# Patient Record
Sex: Male | Born: 1945 | Race: Black or African American | Hispanic: No | Marital: Married | Smoking: Current every day smoker
Health system: Southern US, Community
[De-identification: ages and names within clinical notes are randomized; demographics above are authoritative.]

## PROBLEM LIST (undated history)

## (undated) DIAGNOSIS — M1990 Unspecified osteoarthritis, unspecified site: Secondary | ICD-10-CM

## (undated) DIAGNOSIS — I639 Cerebral infarction, unspecified: Secondary | ICD-10-CM

## (undated) DIAGNOSIS — R0600 Dyspnea, unspecified: Secondary | ICD-10-CM

## (undated) DIAGNOSIS — J449 Chronic obstructive pulmonary disease, unspecified: Secondary | ICD-10-CM

## (undated) DIAGNOSIS — Z720 Tobacco use: Secondary | ICD-10-CM

## (undated) DIAGNOSIS — I2510 Atherosclerotic heart disease of native coronary artery without angina pectoris: Secondary | ICD-10-CM

## (undated) DIAGNOSIS — I509 Heart failure, unspecified: Secondary | ICD-10-CM

## (undated) DIAGNOSIS — E7800 Pure hypercholesterolemia, unspecified: Secondary | ICD-10-CM

## (undated) DIAGNOSIS — I10 Essential (primary) hypertension: Secondary | ICD-10-CM

---

## 2003-08-18 ENCOUNTER — Emergency Department (HOSPITAL_COMMUNITY): Admission: AD | Admit: 2003-08-18 | Discharge: 2003-08-18 | Payer: Self-pay | Admitting: Emergency Medicine

## 2005-07-01 HISTORY — PX: CORONARY ANGIOPLASTY WITH STENT PLACEMENT: SHX49

## 2005-09-09 ENCOUNTER — Inpatient Hospital Stay (HOSPITAL_COMMUNITY): Admission: EM | Admit: 2005-09-09 | Discharge: 2005-09-11 | Payer: Self-pay | Admitting: Emergency Medicine

## 2005-09-10 ENCOUNTER — Encounter (INDEPENDENT_AMBULATORY_CARE_PROVIDER_SITE_OTHER): Payer: Self-pay | Admitting: Cardiology

## 2005-09-24 ENCOUNTER — Encounter: Admission: RE | Admit: 2005-09-24 | Discharge: 2005-12-23 | Payer: Self-pay | Admitting: Neurology

## 2006-04-07 ENCOUNTER — Inpatient Hospital Stay (HOSPITAL_COMMUNITY): Admission: RE | Admit: 2006-04-07 | Discharge: 2006-04-08 | Payer: Self-pay | Admitting: Cardiology

## 2007-05-17 ENCOUNTER — Emergency Department (HOSPITAL_COMMUNITY): Admission: EM | Admit: 2007-05-17 | Discharge: 2007-05-17 | Payer: Self-pay | Admitting: Emergency Medicine

## 2010-11-16 NOTE — Cardiovascular Report (Signed)
Randy Walter, Randy Walter                ACCOUNT NO.:  [id number]   MEDICAL RECORD NO.:  [id number]          PATIENT TYPE:  OIB   LOCATION:  2899                         FACILITY:  [facility]   PHYSICIAN:  Armanda Magic, M.D.     DATE OF BIRTH:  06/13/1945   DATE OF PROCEDURE:  04/07/2006  DATE OF DISCHARGE:                              CARDIAC CATHETERIZATION   PROCEDURE:  Left heart catheterization, coronary angiography.  Left  ventriculography was not performed because of the patient's borderline  creatinine elevation.   OPERATOR:  Armanda Magic, M.D.   INDICATIONS:  Chest pain, LV dysfunction.   COMPLICATIONS:  None.   IV ACCESS:  Via right femoral artery, 6-French sheath.   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  He also  has a history of ongoing tobacco abuse and a history of alcohol intake in  the past.  He was noted back in March to have some LV dysfunction with an EF  of 30-40% that time.  He now presents for cardiac catheterization due to  intermittent chest pain worrisome for angina.   The patient was brought to the cardiac catheterization laboratory in a  fasting nonsedated state.  Informed consent was obtained.  The patient was  connected to continuous heart rate and pulse oximetry monitoring and  intermittent blood pressure monitoring.  The right groin was prepped and  draped in the sterile fashion; 1% Xylocaine was used for local anesthesia.  Using the modified Seldinger technique, a 6-French sheath was placed in  right femoral artery.  Under fluoroscopic guidance, a 6-French JL-4 catheter  was placed in the left coronary artery.  Multiple cine films were taken at  30 degree RAO and 40 degree LAO views.  This catheter was then exchanged out  over a guidewire for a 6-French JR-4 catheter which was placed under  fluoroscopic guidance in the right coronary artery.  Multiple  cine films  were taken in the 30 degree RAO and 40 degree LAO views.  This catheter was  then exchanged out over a guidewire for a 6-French angled pigtail catheter  which was placed under fluoroscopic guidance in the left ventricular cavity.  Left ventricular pressure was measured.  The catheter was then pulled back  across the aortic valve with no significant gradient noted.  At the end of  the procedure, the patient went on to PCI of the LAD by Dr. Katrinka Blazing.   The left main coronary artery is widely patent by bifurcates to the left  anterior descending artery and left circumflex artery.  The left anterior  descending artery gives rise to a very large diagonal one, which is widely  patent and bifurcates into 2 daughter vessels, both of which are widely  patent.  The mid LAD between the first and second diagonals has a 70-80%  lesion, and then the rest of the LAD going to the apex is widely patent.  It  gives off a second diagonal branch which is widely patent.   The  left circumflex is widely patent throughout its course in the AV groove,  giving rise to a large obtuse marginal branch one, which bifurcates into 2  daughter branches and is widely patent.   The right coronary is widely patent in its proximal portion.  There was 50%  narrowing in the mid RCA with then bifurcates into the posterior descending  artery and posterior lateral artery, both of which are widely patent.   Left ventricular pressure 172/3 mmHg, aortic pressure 175/90 mmHg, LVEDP 14  mmHg.   ASSESSMENT:  1. 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  2. Chest pain syndrome,  3. Mild left ventricular dysfunction, ejection fraction by echo in March      of 30 to 40%.  4. Hypertension with elevated blood pressure.   PLAN:  Increase lisinopril to 20 mg a day, aspirin 325 mg a day.  PCI of the  LAD by Dr. Katrinka Blazing.      Armanda Magic, M.D.  Electronically Signed     TT/MEDQ  D:   04/07/2006  T:  04/08/2006  Job:  [id number]   cc:   Almedia Balls

## 2010-11-16 NOTE — Discharge Summary (Signed)
[name], [name] NO.:  [id number]   MEDICAL RECORD NO.:  [id number]          PATIENT TYPE:  INP   LOCATION:  6522                         FACILITY:  [facility]   PHYSICIAN:  [name], P.A.       DATE OF BIRTH:  06/13/1945   DATE OF ADMISSION:  04/07/2006  DATE OF DISCHARGE:  04/08/2006                               DISCHARGE SUMMARY   DISCHARGE DIAGNOSES:  1. Coronary artery disease, status post bare metal stent to the left      anterior descending on April 07, 2006.  2. Hypertension.  3. Mild left ventricular dysfunction by echocardiogram of      approximately 35%.  4. A history of cerebrovascular accident, felt secondary to small-      vessel disease.  5. Remote alcohol use.  6. Tobacco use, smoking cessation.  7. Altered medication use.   HOSPITAL COURSE:  [name] is a 65 year old patient who presented to  the office on April 01, 2006 complaining of substernal chest pain as  well as dyspnea on exertion.  A previous echo around March of 2007 at  the time of his CVA showed an EF of about 30%.  There was moderate  diffuse LV hypokinesis with severe hypokinesis of the basilar inferior  wall with mild increased thickness of the aortic valve.  Left atrium was  dilated.   Because of the patient's symptoms, the patient was brought to [facility] on April 07, 2006 for cardiac catheterization.  He was found  to have a mid LAD lesion, 80%, with a 50% mid RCA lesion.   The patient then underwent percutaneous intervention utilizing a bare  metal stent to the LAD under the care of Dr. Verdis Prime.  The patient  tolerated the procedure well, remained in the hospital overnight, and  was ready for discharge the following day.   DISCHARGE LABS:  Include BUN 8, creatinine 0.9, white count 5.1,  hemoglobin 12.7.   DISCHARGE MEDICATIONS:  Include:  1. Plavix 75 mg a day for 3 weeks.  2. Enteric-coated aspirin 325 mg a day.  3. Lisinopril 20 mg a day.   FOLLOWUP:  Follow up with Dr. Mayford Knife on April 24, 2006 at 10:16 a.m.,  clean over cath site gently with soap and water, call for any recurrent  chest pain, no driving for 2 days, no lifting over 10 pounds for 1 week,  remain on a low-fat diet.      [name], P.A.     LB/MEDQ  D:  06/09/2006  T:  06/10/2006  Job:  [id number]   cc:   Armanda Magic, M.D.  Almedia Balls

## 2010-11-16 NOTE — Discharge Summary (Signed)
[name], [name]                ACCOUNT NO.:  [id number]   MEDICAL RECORD NO.:  [id number]          PATIENT TYPE:  INP   LOCATION:  3014                         FACILITY:  [facility]   PHYSICIAN:  Pramod P. Pearlean Brownie, MD    DATE OF BIRTH:  06/13/1945   DATE OF ADMISSION:  09/09/2005  DATE OF DISCHARGE:  09/11/2005                                 DISCHARGE SUMMARY   DIAGNOSES AT TIME OF DISCHARGE:  1.  Right corona radiata infarct secondary to small-vessel disease.  2.  Hypertension.  3.  Dyslipidemia.  4.  Tobacco abuse.  5.  Alcohol abuse.   MEDICINES AT TIME OF DISCHARGE:  1.  Aspirin 325 mg a day.  2.  Lisinopril 10 mg a day.   STUDIES PERFORMED:  1.  CT of the brain on admission showed atrophy with bilateral basal ganglia      lacunar infarcts, no acute abnormalities.  2.  MRI of the brain shows acute infarct, right frontoparietal      periventricular white matter.  3.  MRA of the head shows moderate intracranial atherosclerotic disease.  4.  Chest x-ray shows COPD with bronchitic and minimal interstitial changes,      question bilateral nipple shadows.  Repeat chest x-ray is recommended.      It has been ordered prior to discharge.  5.  Carotid Doppler is normal.  6.  Transcranial Dopplers:  No significant stenosis.  Has decreased      posterior circulation secondary to poor windows.  7.  2-D echocardiogram has been performed, results pending.  8.  EKG shows normal sinus rhythm with first degree AV block, nonspecific T-      wave abnormality, prolonged QT.   LABORATORY STUDIES:  Hemoglobin 13.0, hematocrit 38.3, white blood cells  3.5, red blood cells 4.08, RDW was 14.7, otherwise normal.  Differential was  40 of neutrophils, 48 lymphocytes, 10 monocytes, 2 eosinophils, 1 basophil.  Coagulation studies were normal.  Chemistry normal.  Liver function tests  normal.  Hemoglobin A1c 6.1.  Alcohol level was 43.  Urinalysis was normal.  Fasting lipid panel and homocysteine are  pending at time of discharge.   HISTORY OF PRESENT ILLNESS:  [name] is a 65 year old right-handed  white male with no known medical history.  He had sudden onset of weakness  and a funny feeling involving his left arm, sparing his left face, on  Saturday, September 07, 2005.  It worsened on September 09, 2005.  The patient's  wife also reports that he had some slurred speech with fluctuation of his  symptoms.  He presented for evaluation.  CT of the head in the emergency  room showed no acute abnormality.  He was admitted for further stroke  workup.   MRI did reveal an acute infarct, felt to be etiology of his symptoms.  He  has multiple vascular risk factors, including hypertension which is not  treated, dyslipidemia which is not treated, and smoking and alcohol abuse.  The patient has been advised to decrease alcohol to two 1-ounce liquor  drinks per day and  to stop smoking.  He was placed on Altace for blood  pressure control, which was changed to lisinopril at the time of discharge  secondary to cost and availability of lisinopril at [facility] for $4.  The  patient was placed on aspirin for secondary stroke prevention.  He continues  with some mild clumsiness in his left upper extremity secondary to the  stroke, and occupational therapy recommended outpatient OT follow-up.  They  also recommended driving assessment; however, the patient does not drive and  has his wife and son transport him.  We will arrange outpatient OT.   CONDITION ON DISCHARGE:  Patient alert and oriented x3, no aphasia or  dysarthria.  Minimal left facial asymmetry.  Left fine motor movement  decreased and right arm orbits over the left.  He has no leg weakness, no  sensory loss.  He ambulates with minimal difficulty.  His chest is clear to  auscultation. His heart rate is regular.  His abdomen is nontender.   DISCHARGE PLAN:  1.  Discharge home with family.  2.  Aspirin for secondary stroke prevention.  3.   Tight risk factor control needed by primary care physician for blood      pressure, alcohol and smoking as well as dyslipidemia.  4.  Get a primary care physician.  The patient has been referred to      [facility].  5.  Follow up with [name], N.P., at [facility] in one month.      [name], N.P.    ______________________________  Sunny Schlein. Pearlean Brownie, MD    SB/MEDQ  D:  09/11/2005  T:  09/12/2005  Job:  [id number]   cc:   Carolyne Fiscal

## 2010-11-16 NOTE — H&P (Signed)
NAME:  Randy Walter, Randy Walter NO.:  [id number]   MEDICAL RECORD NO.:  [id number]          PATIENT TYPE:  EMS   LOCATION:  MAJO                         FACILITY:  [facility]   PHYSICIAN:  Michael L. Reynolds, M.D.DATE OF BIRTH:  06/13/1945   DATE OF ADMISSION:  09/09/2005  DATE OF DISCHARGE:                                HISTORY & PHYSICAL   CHIEF COMPLAINT:  Gait unsteadiness and left-sided feels funny.   HISTORY OF PRESENT ILLNESS:  This is the initial inpatient consultation  evaluation of this 65 year old man with no known chronic medical problems.  The patient reports that since Saturday, September 07, 2004 he has had a  sensation of weakness and a funny feeling involving his left arm and leg  sparing his face which worsened today.  His wife reports today that he has  had some slurred speech.  He has had a little bit of fluctuation in  symptoms.  He denies any known history of stroke.   PAST MEDICAL HISTORY:  No known chronic medical problems, though the patient  does not go to doctors.   FAMILY HISTORY:  Remarkable for hypertension.   SOCIAL HISTORY:  He lives with his wife.  He is normally independent in his  activities of daily living.  He smokes a half pack of cigarettes a day and  consumes about a half gallon of liquor per week.   ALLERGIES:  No known drug allergies.   MEDICATIONS:  None.   REVIEW OF SYSTEMS:  He reports occasional left-sided headaches, occasional  dyspnea on exertion.  He denies chest pain or palpitation.  Otherwise, full  10-system review of systems negative except as outlined in the HPI and in  the ER and admission nursing records.   PHYSICAL EXAMINATION:  VITAL SIGNS:  Temperature 98.4, blood pressure  187/106, pulse 81, respirations 20.  GENERAL:  This is a healthy-appearing man, seated, in no evident distress.  HEENT:  Head:  Cranium is normocephalic, atraumatic.  Oropharynx benign.  NECK:  Supple without carotid or supraclavicular  bruits.  HEART:  Regular rate and rhythm without murmurs.  CHEST:  Clear to auscultation bilaterally.  ABDOMEN:  Soft.  Normoactive bowel sounds.  EXTREMITIES:  No edema.  NEUROLOGIC:  Mental status:  He is awake, alert, and fully oriented.  He is  able to name objects and repeat phrases without difficulty.  He is able to  follow one and two-step commands without difficulty.  Recent and remote  memory are intact.  Attention span, concentration, and fund of knowledge are  all adequate.  Cranial nerves:  Funduscopic examination reveals chronic  hypertensive changes.  Pupils equal and reactive.  Extraocular movements  full without nystagmus.  Visual fields full to confrontation.  Hearing is  intact.  Conversational speech.  Face intact to pinprick.  Face, tongue, and  palate move normally and symmetrically.  Motor:  Normal bulk and tone.  Normal strength in all tested extremity muscles.  Sensation intact to light  touch and pin prick in all extremities.  Coordination:  Rapid movements were  performed well.  Finger to  nose is performed well.  Gait:  He arises easily  from a chair and stance is normal.  He has great difficulty standing with a  narrow base tending to lean to the left and he falls almost immediately to  the left upon closing his eyes for a Romberg maneuver.  Reflexes 2+ and  symmetric.  Toes are downgoing.   LABORATORIES:  CBC:  White count 3.5, hemoglobin 13, platelets 227,000.  BMET is unremarkable.  LFTs are pending.  Coags are normal.  Urinalysis was  negative.  CT of the head is personally reviewed.  It demonstrates bilateral  lacunar infarcts of uncertain age.   IMPRESSION:  Probable right subcortical lacunar stroke with left subjective  symptoms and gait disorder.  Primary risk factor is uncontrolled  hypertension.   PLAN:  Will admit for MRI/MRA, carotid and transcranial Dopplers, 2-D  echocardiogram, and stroke laboratories.  Will place on aspirin and probably   start hypertension medications.  He will be evaluated by physical and  occupational, and speech therapy.  Stroke service to follow.      Michael L. Thad Ranger, M.D.  Electronically Signed     MLR/MEDQ  D:  09/09/2005  T:  09/10/2005  Job:  [id number]

## 2010-11-16 NOTE — Cardiovascular Report (Signed)
NAMEPRIMUS, GRITTON NO.:  [id number]   MEDICAL RECORD NO.:  [id number]          PATIENT TYPE:  INP   LOCATION:  2807                         FACILITY:  [facility]   PHYSICIAN:  Lyn Records, M.D.   DATE OF BIRTH:  06/13/1945   DATE OF PROCEDURE:  04/07/2006  DATE OF DISCHARGE:                              CARDIAC CATHETERIZATION   INDICATIONS:  Patient with a cardiomyopathy, decreased LV function and  exertional angina.  Catheterization today demonstrated a 70% to at most 80%  LAD in the mid vessel.   PROCEDURE PERFORMED:  Percutaneous coronary intervention with direct stent  using a nondrug-eluting stent.   DESCRIPTION:  A 6-French sheath used for the diagnostic catheterization was  exchanged under sterile conditions.   The patient and his wife were informed of the procedure and the likelihood  that the LAD lesion was causing angina.  They agreed to proceeding with  stenting.  We discussed the merit of drug-eluting stent versus bare-metal  stent.  We decided on bare-metal stent because of the size of the vessel and  also some anticipated difficulty with finances and staying on Plavix for a  year.   An XB 6-French 3.5 catheter was used to obtain guiding shots, and an Marketing executive was used to cross the LAD lesion.  Direct stenting using an 18  x 3.5-mm Vision stent was performed to 14 atmospheres, and post dilatation  was performed with a 3.5 x 12-mm Quantum balloon to 16 atmospheres.  The  patient tolerated the procedure without complications.  The final  angiographic result demonstrated a step-up.  Zero percent stenosis was felt  to be present post stent implantation with TIMI grade 3 flow noted.   CONCLUSION:  Successful direct stent of the LAD with a bare-metal stent  (Vision) from 70% to 0%.   PLAN:  Per Dr. Mayford Knife, Plavix for at least 3 weeks.      Lyn Records, M.D.  Electronically Signed     HWS/MEDQ  D:  04/07/2006  T:  04/08/2006   Job:  [id number]   cc:   Almedia Balls

## 2011-04-09 LAB — URINALYSIS, ROUTINE W REFLEX MICROSCOPIC
Glucose, UA: NEGATIVE
Protein, ur: NEGATIVE
Specific Gravity, Urine: 1.027
Urobilinogen, UA: 1

## 2013-12-03 ENCOUNTER — Emergency Department (HOSPITAL_COMMUNITY): Payer: Medicare Other

## 2013-12-03 ENCOUNTER — Inpatient Hospital Stay (HOSPITAL_COMMUNITY)
Admission: EM | Admit: 2013-12-03 | Discharge: 2013-12-05 | DRG: 191 | Disposition: A | Discharge status: Home Health | Payer: Medicare Other | Attending: Internal Medicine | Admitting: Internal Medicine

## 2013-12-03 ENCOUNTER — Encounter (HOSPITAL_COMMUNITY): Payer: Self-pay | Admitting: Emergency Medicine

## 2013-12-03 DIAGNOSIS — I509 Heart failure, unspecified: Secondary | ICD-10-CM

## 2013-12-03 DIAGNOSIS — J441 Chronic obstructive pulmonary disease with (acute) exacerbation: Secondary | ICD-10-CM

## 2013-12-03 DIAGNOSIS — F17200 Nicotine dependence, unspecified, uncomplicated: Secondary | ICD-10-CM | POA: Diagnosis present

## 2013-12-03 DIAGNOSIS — J209 Acute bronchitis, unspecified: Principal | ICD-10-CM

## 2013-12-03 DIAGNOSIS — I2510 Atherosclerotic heart disease of native coronary artery without angina pectoris: Secondary | ICD-10-CM

## 2013-12-03 DIAGNOSIS — I10 Essential (primary) hypertension: Secondary | ICD-10-CM

## 2013-12-03 DIAGNOSIS — I5042 Chronic combined systolic (congestive) and diastolic (congestive) heart failure: Secondary | ICD-10-CM

## 2013-12-03 DIAGNOSIS — R0602 Shortness of breath: Secondary | ICD-10-CM

## 2013-12-03 DIAGNOSIS — Z9861 Coronary angioplasty status: Secondary | ICD-10-CM

## 2013-12-03 DIAGNOSIS — R0902 Hypoxemia: Secondary | ICD-10-CM

## 2013-12-03 DIAGNOSIS — R509 Fever, unspecified: Secondary | ICD-10-CM

## 2013-12-03 DIAGNOSIS — J449 Chronic obstructive pulmonary disease, unspecified: Secondary | ICD-10-CM

## 2013-12-03 DIAGNOSIS — J440 Chronic obstructive pulmonary disease with acute lower respiratory infection: Principal | ICD-10-CM | POA: Diagnosis present

## 2013-12-03 DIAGNOSIS — I160 Hypertensive urgency: Secondary | ICD-10-CM

## 2013-12-03 DIAGNOSIS — Z8249 Family history of ischemic heart disease and other diseases of the circulatory system: Secondary | ICD-10-CM

## 2013-12-03 HISTORY — DX: Unspecified osteoarthritis, unspecified site: M19.90

## 2013-12-03 HISTORY — DX: Essential (primary) hypertension: I10

## 2013-12-03 HISTORY — DX: Atherosclerotic heart disease of native coronary artery without angina pectoris: I25.10

## 2013-12-03 HISTORY — DX: Tobacco use: Z72.0

## 2013-12-03 LAB — CBC WITH DIFFERENTIAL/PLATELET
BASOS ABS: 0 10*3/uL (ref 0.0–0.1)
BASOS PCT: 0 % (ref 0–1)
EOS ABS: 0 10*3/uL (ref 0.0–0.7)
EOS PCT: 1 % (ref 0–5)
HEMATOCRIT: 41.6 % (ref 39.0–52.0)
HEMOGLOBIN: 13.4 g/dL (ref 13.0–17.0)
Lymphocytes Relative: 13 % (ref 12–46)
Lymphs Abs: 0.6 10*3/uL — ABNORMAL LOW (ref 0.7–4.0)
MCH: 30 pg (ref 26.0–34.0)
MCHC: 32.2 g/dL (ref 30.0–36.0)
MCV: 93.1 fL (ref 78.0–100.0)
MONO ABS: 0.5 10*3/uL (ref 0.1–1.0)
MONOS PCT: 10 % (ref 3–12)
Neutro Abs: 3.6 10*3/uL (ref 1.7–7.7)
Neutrophils Relative %: 76 % (ref 43–77)
Platelets: 179 10*3/uL (ref 150–400)
RBC: 4.47 MIL/uL (ref 4.22–5.81)
RDW: 14.2 % (ref 11.5–15.5)
WBC: 4.8 10*3/uL (ref 4.0–10.5)

## 2013-12-03 LAB — BASIC METABOLIC PANEL
BUN: 11 mg/dL (ref 6–23)
CALCIUM: 9.3 mg/dL (ref 8.4–10.5)
CHLORIDE: 101 meq/L (ref 96–112)
CO2: 27 mEq/L (ref 19–32)
CREATININE: 0.89 mg/dL (ref 0.50–1.35)
GFR calc non Af Amer: 86 mL/min — ABNORMAL LOW (ref >90)
Glucose, Bld: 112 mg/dL — ABNORMAL HIGH (ref 70–99)
Potassium: 4.3 mEq/L (ref 3.7–5.3)
Sodium: 139 mEq/L (ref 137–147)

## 2013-12-03 LAB — I-STAT CG4 LACTIC ACID, ED: LACTIC ACID, VENOUS: 1.42 mmol/L (ref 0.5–2.2)

## 2013-12-03 LAB — PRO B NATRIURETIC PEPTIDE: Pro B Natriuretic peptide (BNP): 2274 pg/mL — ABNORMAL HIGH (ref 0–125)

## 2013-12-03 LAB — TROPONIN I: Troponin I: <0.3 ng/mL (ref <0.30)

## 2013-12-03 MED ORDER — DEXTROSE 5 % IV SOLN
500.0000 mg | Freq: Once | INTRAVENOUS | Status: AC
  Administered 2013-12-03: 500 mg via INTRAVENOUS
  Filled 2013-12-03: qty 500

## 2013-12-03 MED ORDER — SODIUM CHLORIDE 0.9 % IJ SOLN
3.0000 mL | Freq: Two times a day (BID) | INTRAMUSCULAR | Status: DC
  Administered 2013-12-05: 3 mL via INTRAVENOUS

## 2013-12-03 MED ORDER — ACETAMINOPHEN 650 MG RE SUPP: 650.0000 mg | Freq: Four times a day (QID) | RECTAL | Status: DC | PRN

## 2013-12-03 MED ORDER — HYDROMORPHONE HCL PF 1 MG/ML IJ SOLN: 0.5000 mg | INTRAMUSCULAR | Status: DC | PRN

## 2013-12-03 MED ORDER — SODIUM CHLORIDE 0.9 % IV SOLN: 250.0000 mL | INTRAVENOUS | Status: DC | PRN

## 2013-12-03 MED ORDER — ONDANSETRON HCL 4 MG/2ML IJ SOLN
4.0000 mg | Freq: Once | INTRAMUSCULAR | Status: AC
  Administered 2013-12-03: 4 mg via INTRAVENOUS
  Filled 2013-12-03: qty 2

## 2013-12-03 MED ORDER — METHYLPREDNISOLONE SODIUM SUCC 125 MG IJ SOLR
125.0000 mg | Freq: Once | INTRAMUSCULAR | Status: AC
  Administered 2013-12-03: 125 mg via INTRAVENOUS
  Filled 2013-12-03: qty 2

## 2013-12-03 MED ORDER — AZITHROMYCIN 500 MG IV SOLR
500.0000 mg | INTRAVENOUS | Status: DC
  Administered 2013-12-04: 500 mg via INTRAVENOUS
  Filled 2013-12-03: qty 500

## 2013-12-03 MED ORDER — ALUM & MAG HYDROXIDE-SIMETH 200-200-20 MG/5ML PO SUSP: 30.0000 mL | Freq: Four times a day (QID) | ORAL | Status: DC | PRN

## 2013-12-03 MED ORDER — FUROSEMIDE 10 MG/ML IJ SOLN
40.0000 mg | Freq: Two times a day (BID) | INTRAMUSCULAR | Status: DC
  Administered 2013-12-04 – 2013-12-05 (×3): 40 mg via INTRAVENOUS
  Filled 2013-12-03 (×4): qty 4

## 2013-12-03 MED ORDER — ONDANSETRON HCL 4 MG PO TABS: 4.0000 mg | ORAL_TABLET | Freq: Four times a day (QID) | ORAL | Status: DC | PRN

## 2013-12-03 MED ORDER — ACETAMINOPHEN 325 MG PO TABS
650.0000 mg | ORAL_TABLET | Freq: Once | ORAL | Status: AC
  Administered 2013-12-03: 650 mg via ORAL
  Filled 2013-12-03: qty 2

## 2013-12-03 MED ORDER — DEXTROSE 5 % IV SOLN: 1.0000 g | INTRAVENOUS | Status: DC

## 2013-12-03 MED ORDER — PNEUMOCOCCAL VAC POLYVALENT 25 MCG/0.5ML IJ INJ
0.5000 mL | INJECTION | INTRAMUSCULAR | Status: AC
  Administered 2013-12-04: 0.5 mL via INTRAMUSCULAR
  Filled 2013-12-03 (×2): qty 0.5

## 2013-12-03 MED ORDER — ACETAMINOPHEN 325 MG PO TABS
650.0000 mg | ORAL_TABLET | Freq: Four times a day (QID) | ORAL | Status: DC | PRN
  Filled 2013-12-03: qty 2

## 2013-12-03 MED ORDER — FUROSEMIDE 10 MG/ML IJ SOLN
40.0000 mg | Freq: Once | INTRAMUSCULAR | Status: AC
  Administered 2013-12-03: 40 mg via INTRAVENOUS
  Filled 2013-12-03: qty 4

## 2013-12-03 MED ORDER — ENOXAPARIN SODIUM 40 MG/0.4ML ~~LOC~~ SOLN
40.0000 mg | Freq: Every day | SUBCUTANEOUS | Status: DC
  Administered 2013-12-04 (×2): 40 mg via SUBCUTANEOUS
  Filled 2013-12-03 (×3): qty 0.4

## 2013-12-03 MED ORDER — OXYCODONE HCL 5 MG PO TABS
5.0000 mg | ORAL_TABLET | ORAL | Status: DC | PRN
  Administered 2013-12-04: 5 mg via ORAL
  Filled 2013-12-03: qty 1

## 2013-12-03 MED ORDER — SODIUM CHLORIDE 0.9 % IJ SOLN: 3.0000 mL | INTRAMUSCULAR | Status: DC | PRN

## 2013-12-03 MED ORDER — MORPHINE SULFATE 4 MG/ML IJ SOLN
4.0000 mg | INTRAMUSCULAR | Status: DC | PRN
  Administered 2013-12-03: 4 mg via INTRAVENOUS
  Filled 2013-12-03: qty 1

## 2013-12-03 MED ORDER — ALBUTEROL SULFATE (2.5 MG/3ML) 0.083% IN NEBU
2.5000 mg | INHALATION_SOLUTION | Freq: Four times a day (QID) | RESPIRATORY_TRACT | Status: DC
  Administered 2013-12-04: 2.5 mg via RESPIRATORY_TRACT
  Filled 2013-12-03 (×3): qty 3

## 2013-12-03 MED ORDER — ONDANSETRON HCL 4 MG/2ML IJ SOLN: 4.0000 mg | Freq: Four times a day (QID) | INTRAMUSCULAR | Status: DC | PRN

## 2013-12-03 MED ORDER — DEXTROSE 5 % IV SOLN
1.0000 g | Freq: Once | INTRAVENOUS | Status: AC
  Administered 2013-12-03: 1 g via INTRAVENOUS
  Filled 2013-12-03: qty 10

## 2013-12-03 MED ORDER — ENALAPRILAT 1.25 MG/ML IV SOLN
1.2500 mg | Freq: Once | INTRAVENOUS | Status: AC
  Administered 2013-12-03: 1.25 mg via INTRAVENOUS
  Filled 2013-12-03: qty 1

## 2013-12-03 MED ORDER — NICOTINE 14 MG/24HR TD PT24
14.0000 mg | MEDICATED_PATCH | Freq: Every day | TRANSDERMAL | Status: DC
  Administered 2013-12-04 – 2013-12-05 (×3): 14 mg via TRANSDERMAL
  Filled 2013-12-03 (×3): qty 1

## 2013-12-03 MED ORDER — ACETAMINOPHEN 325 MG PO TABS
650.0000 mg | ORAL_TABLET | ORAL | Status: DC | PRN
  Administered 2013-12-04: 650 mg via ORAL

## 2013-12-03 MED ORDER — POTASSIUM CHLORIDE CRYS ER 20 MEQ PO TBCR
20.0000 meq | EXTENDED_RELEASE_TABLET | Freq: Two times a day (BID) | ORAL | Status: AC
  Administered 2013-12-04 – 2013-12-05 (×4): 20 meq via ORAL
  Filled 2013-12-03 (×4): qty 1

## 2013-12-03 MED ORDER — CARVEDILOL 3.125 MG PO TABS
3.1250 mg | ORAL_TABLET | Freq: Two times a day (BID) | ORAL | Status: DC
  Administered 2013-12-04 – 2013-12-05 (×5): 3.125 mg via ORAL
  Filled 2013-12-03 (×7): qty 1

## 2013-12-03 MED ORDER — PREDNISONE 50 MG PO TABS
60.0000 mg | ORAL_TABLET | Freq: Once | ORAL | Status: AC
  Administered 2013-12-04: 60 mg via ORAL
  Filled 2013-12-03: qty 1

## 2013-12-03 MED ORDER — ALBUTEROL SULFATE (2.5 MG/3ML) 0.083% IN NEBU
2.5000 mg | INHALATION_SOLUTION | RESPIRATORY_TRACT | Status: DC | PRN
  Administered 2013-12-03: 2.5 mg via RESPIRATORY_TRACT
  Filled 2013-12-03: qty 3

## 2013-12-03 MED ORDER — LISINOPRIL 10 MG PO TABS
10.0000 mg | ORAL_TABLET | Freq: Every day | ORAL | Status: DC
  Administered 2013-12-04 – 2013-12-05 (×2): 10 mg via ORAL
  Filled 2013-12-03 (×2): qty 1

## 2013-12-03 MED ORDER — SODIUM CHLORIDE 0.9 % IJ SOLN
3.0000 mL | Freq: Two times a day (BID) | INTRAMUSCULAR | Status: DC
  Administered 2013-12-04: 3 mL via INTRAVENOUS

## 2013-12-03 MED ORDER — ALBUTEROL SULFATE (2.5 MG/3ML) 0.083% IN NEBU: 2.5000 mg | INHALATION_SOLUTION | RESPIRATORY_TRACT | Status: DC | PRN

## 2013-12-03 NOTE — ED Notes (Signed)
Called to give report nurse unavailable will call back.  

## 2013-12-03 NOTE — ED Provider Notes (Addendum)
CSN: [id number]     Arrival date & time 12/03/13  1848 History   First MD Initiated Contact with Patient 12/03/13 1851     Chief Complaint  Patient presents with  . Cough      HPI  Patient presents via EMS. He reports a one-week illness. States that he feels hot and cold, and chills, but has not checked temperature. Feesl short of breath. Given albuterol in route. States this did slightly help. Is hypertensive over 200 systolic in route. Patient states it has been over 7 years since he has seen a physician. He is a daily smoker. He denies any documented history of hypertension or heart disease.  Denies any chest pain. States it hurts under his ribs when he coughs and has been coughing a lot. Feels short of breath at rest. No abdominal pain, nausea, vomiting, diarrhea. No extremity pain or swelling. No frank PND orthopnea. No exertional chest pain.  Past Medical History  Diagnosis Date  . Hypertension    History reviewed. No pertinent past surgical history. History reviewed. No pertinent family history. History  Substance Use Topics  . Smoking status: Current Every Day Smoker -- 0.50 packs/day for 60 years    Types: Cigarettes  . Smokeless tobacco: Never Used  . Alcohol Use: Yes    Review of Systems  Constitutional: Positive for fever. Negative for chills, diaphoresis, appetite change and fatigue.  HENT: Negative for mouth sores, sore throat and trouble swallowing.   Eyes: Negative for visual disturbance.  Respiratory: Positive for shortness of breath and wheezing. Negative for cough and chest tightness.   Cardiovascular: Negative for chest pain.       Pain with cough  Gastrointestinal: Negative for nausea, vomiting, abdominal pain, diarrhea and abdominal distention.  Endocrine: Negative for polydipsia, polyphagia and polyuria.  Genitourinary: Negative for dysuria, frequency and hematuria.  Musculoskeletal: Negative for gait problem.  Skin: Negative for color change, pallor and  rash.  Neurological: Positive for weakness. Negative for dizziness, syncope, light-headedness and headaches.  Hematological: Does not bruise/bleed easily.  Psychiatric/Behavioral: Negative for behavioral problems and confusion.      Allergies  Review of patient's allergies indicates no known allergies.  Home Medications   Prior to Admission medications   Not on File   BP 179/94  Pulse 106  Temp(Src) 99.8 F (37.7 C) (Oral)  Resp 22  SpO2 87% Physical Exam  Constitutional: He is oriented to person, place, and time. He appears well-developed and well-nourished. No distress.  Mild respiratory distress.  HENT:  Head: Normocephalic.  Eyes: Conjunctivae are normal. Pupils are equal, round, and reactive to light. No scleral icterus.  Neck: Normal range of motion. Neck supple. No thyromegaly present.  No JVD  Cardiovascular: Normal rate and regular rhythm.  Exam reveals no gallop and no friction rub.   No murmur heard. Pulmonary/Chest: Effort normal and breath sounds normal. No respiratory distress. He has no wheezes. He has no rales.  Diffuse wheezing and prolongation.   Abdominal: Soft. Bowel sounds are normal. He exhibits no distension. There is no tenderness. There is no rebound.  Musculoskeletal: Normal range of motion.  Neurological: He is alert and oriented to person, place, and time.  Skin: Skin is warm and dry. No rash noted.  No peripheral edema  Psychiatric: He has a normal mood and affect. His behavior is normal.    ED Course  Procedures (including critical care time) Labs Review Labs Reviewed  CBC WITH DIFFERENTIAL - Abnormal; Notable for the  following:    Lymphs Abs 0.6 (*)    All other components within normal limits  BASIC METABOLIC PANEL - Abnormal; Notable for the following:    Glucose, Bld 112 (*)    GFR calc non Af Amer 86 (*)    All other components within normal limits  PRO B NATRIURETIC PEPTIDE - Abnormal; Notable for the following:    Pro B  Natriuretic peptide (BNP) 2274.0 (*)    All other components within normal limits  CULTURE, BLOOD (ROUTINE X 2)  CULTURE, BLOOD (ROUTINE X 2)  TROPONIN I  I-STAT CG4 LACTIC ACID, ED    Imaging Review Dg Chest Port 1 View  12/03/2013   CLINICAL DATA:  Cough.  Chest pain.  EXAM: PORTABLE CHEST - 1 VIEW  COMPARISON:  Chest x-ray 09/09/2005.  FINDINGS: Diffuse peribronchial cuffing. No consolidative airspace disease. No pleural effusions. No evidence of pulmonary edema. Heart size is normal. Mediastinal contours are unremarkable. No pneumothorax.  IMPRESSION: 1. Diffuse peribronchial cuffing concerning for acute bronchitis.   Electronically Signed   By: Trudie Reed M.D.   On: 12/03/2013 19:38     EKG Interpretation   Date/Time:  Friday December 03 2013 18:56:53 EDT Ventricular Rate:  117 PR Interval:  175 QRS Duration: 106 QT Interval:  317 QTC Calculation: 442 R Axis:   48 Text Interpretation:  Sinus or ectopic atrial tachycardia Probable left  atrial enlargement Left ventricular hypertrophy Nonspecific T  abnormalities, lateral leads , new since last tracing Baseline wander in  lead(s) V2 Since last tracing rate faster Confirmed by KNAPP  MD-J, JON  ([id number]) on 12/03/2013 7:04:09 PM      MDM   Final diagnoses:  COPD exacerbation  CHF (congestive heart failure)  Hypertensive urgency  Fever  Hypoxemia    Recheck at the bedside 101.2 on my evaluation. Blood cultures obtained. Given Solu-Medrol, albuterol.After nebulized albuterol, 87% saturations.   Pt re-examined.  Improved air movement, less WOB.  Still with prolongation. Chest x-ray shows mild diffuse process. No organized infiltrate. BNP elevated. May be somewhat of a mixed picture with hypertension and CHF. Although febrile, and bronchospastic. BP improved after IV morphine.  Given Vasotec, and Lasix.  No acute abnormalities on EKG to suggest infarct. Normal troponin. Plan will be admission. He is less dyspneic. Work of   breathing is improved.    Rolland Porter, MD 12/03/13 0102  Rolland Porter, MD 12/03/13 2039

## 2013-12-03 NOTE — Progress Notes (Signed)
  CARE MANAGEMENT ED NOTE 12/03/2013  Patient:  Randy Walter, Randy Walter   Account Number:  [id number]  Date Initiated:  12/03/2013  Documentation initiated by:  Radford Pax  Subjective/Objective Assessment:   Patient presents to Ed with nonproductive cough for one week with wheezing.     Subjective/Objective Assessment Detail:   Patient with pmhx of hypertension     Action/Plan:   Action/Plan Detail:   Anticipated DC Date:       Status Recommendation to Physician:   Result of Recommendation:    Other ED Services  Consult Working Plan    DC Planning Services  Other  PCP issues    Choice offered to / List presented to:            Status of service:  Completed, signed off  ED Comments:   ED Comments Detail:  EDCM spoke to patient at bedside.  Patient confirms he has Medicare insurance without a pcp.  [facility] provided patient with a printed list of pcps who accept Medicare insurance within a 5 mile radius of patient's zip code [address].  PCP list given to patient's family member at bedside.  Patient thankful for resources.  No further EDCM needs at this time.

## 2013-12-03 NOTE — H&P (Signed)
[facility] History and Physical  Randy Walter [id number] DOB: 10-22-1945 DOA: 12/03/2013  Referring physician: EDP PCP: No primary provider on file.  Specialists:   Chief Complaint: Fever Cough and SOB  HPI: Randy Walter is a 68 y.o. male with a history of CAD, HTN, and Tobacco Use who has not seen a PCP in 7 years who presents to the ED with complaints of fevers ,chills and cough and SOB worsening over the past week.   He was hypoxic in the ED at a level of  87% in the ED and was placed on supplemental [place])2.  He was found to have a Pro-BNP of 2274.0, and a Chest X-ray revealing peribronchial thickening consistent with Acute Bronchitis. He was started on therapy and referred for medical admission.     Review of Systems:  Constitutional: No Weight Loss, No Weight Gain, Night Sweats, +Fevers, +Chills, Fatigue, +Generalized Weakness HEENT: No Headaches, Difficulty Swallowing,Tooth/Dental Problems,Sore Throat,  No Sneezing, Rhinitis, Ear Ache, Nasal Congestion, or Post Nasal Drip,  Cardio-vascular:  No Chest pain, Orthopnea, PND, Edema in lower extremities, Anasarca, Dizziness, Palpitations  Resp: +Dyspnea, +DOE, +Productive Cough, No Hemoptysis, +Wheezing.    GI: No Heartburn, Indigestion, Abdominal Pain, Nausea, Vomiting, Diarrhea, Change in Bowel Habits,  Loss of Appetite  GU: No Dysuria, Change in Color of Urine, No Urgency or Frequency.  No flank pain.  Musculoskeletal: No Joint Pain or Swelling.  No Decreased Range of Motion. No Back Pain.  Neurologic: No Syncope, No Seizures, Muscle Weakness, Paresthesia, Vision Disturbance or Loss, No Diplopia, No Vertigo, No Difficulty Walking,  Skin: No Rash or Lesions. Psych: No Change in Mood or Affect. No Depression or Anxiety. No Memory loss. No Confusion or Hallucinations   Past Medical History  Diagnosis Date  . Hypertension   . Arthritis   . CAD (coronary artery disease)   . Tobacco use     Past Surgical History   Procedure Laterality Date  . Coronary stent placement       HOME MEDICATIONS:    NONE  No Known Allergies   Social History:  Married  reports that he has been smoking Cigarettes.  He has a 30 pack-year smoking history. He has never used smokeless tobacco. He reports that he drinks alcohol. He reports that he does not use illicit drugs.     Family History  Problem Relation Age of Onset  . CAD Maternal Grandmother       Physical Exam:  GEN:  Pleasant Elderly Well Nourished and Well Developed 68 y.o. African American male examined and in no acute distress; cooperative with exam Filed Vitals:   12/03/13 1857 12/03/13 1931 12/03/13 1952 12/03/13 2100  BP: 213/125  179/94 170/87  Pulse: 118  106 93  Temp: 99.8 F (37.7 C)   99.1 F (37.3 C)  TempSrc: Oral   Oral  Resp: 28  22 20   SpO2: 93% 92% 87% 93%   Blood pressure 170/87, pulse 93, temperature 99.1 F (37.3 C), temperature source Oral, resp. rate 20, SpO2 93.00%. PSYCH: He is alert and oriented x4; does not appear anxious does not appear depressed; affect is normal HEENT: Normocephalic and Atraumatic, Mucous membranes pink; PERRLA; EOM intact; Fundi:  Benign;  No scleral icterus, Nares: Patent, Oropharynx: Clear, Poor Dentition, Neck:  FROM, no cervical lymphadenopathy nor thyromegaly or carotid bruit; no JVD; Breasts:: Not examined CHEST WALL: No tenderness CHEST: Unlabored, but Mildly Tachypneic with Occasional Rhonchi, and Expiratory Wheezes HEART: Tachycardic but Regular rhythm;  no murmurs rubs or gallops BACK: No kyphosis or scoliosis; no CVA tenderness ABDOMEN: Positive Bowel Sounds, Obese, soft non-tender; no masses, no organomegaly. Rectal Exam: Not done EXTREMITIES: No cyanosis, clubbing or edema; no ulcerations. Genitalia: not examined PULSES: 2+ and symmetric SKIN: Normal hydration no rash or ulceration CNS:  Alert and oriented X 4, No Focal Deficits.   Vascular: pulses palpable throughout    Labs on  Admission:  Basic Metabolic Panel:  Recent Labs Lab 12/03/13 1935  NA 139  K 4.3  CL 101  CO2 27  GLUCOSE 112*  BUN 11  CREATININE 0.89  CALCIUM 9.3   Liver Function Tests: No results found for this basename: AST, ALT, ALKPHOS, BILITOT, PROT, ALBUMIN,  in the last 168 hours No results found for this basename: LIPASE, AMYLASE,  in the last 168 hours No results found for this basename: AMMONIA,  in the last 168 hours CBC:  Recent Labs Lab 12/03/13 1935  WBC 4.8  NEUTROABS 3.6  HGB 13.4  HCT 41.6  MCV 93.1  PLT 179   Cardiac Enzymes:  Recent Labs Lab 12/03/13 1935  TROPONINI <0.30    BNP (last 3 results)  Recent Labs  12/03/13 1935  PROBNP 2274.0*   CBG: No results found for this basename: GLUCAP,  in the last 168 hours  Radiological Exams on Admission: Dg Chest Port 1 View  12/03/2013   CLINICAL DATA:  Cough.  Chest pain.  EXAM: PORTABLE CHEST - 1 VIEW  COMPARISON:  Chest x-ray 09/09/2005.  FINDINGS: Diffuse peribronchial cuffing. No consolidative airspace disease. No pleural effusions. No evidence of pulmonary edema. Heart size is normal. Mediastinal contours are unremarkable. No pneumothorax.  IMPRESSION: 1. Diffuse peribronchial cuffing concerning for acute bronchitis.   Electronically Signed   By: Trudie Reed M.D.   On: 12/03/2013 19:38     EKG: Independently reviewed. Sinus Tachycardia at 117 LVH changes present, No Acute S-T changes.      Assessment/Plan:   68 y.o. male with  Principal Problem:   SOB (shortness of breath) Active Problems:   Acute CHF   COPD (chronic obstructive pulmonary disease)   Acute bronchitis   Uncontrolled hypertension   CAD (coronary artery disease)    Tobacco Use Disorder     1.   SOB - due to Acute Bronchitis superimposed on Acute vs Chronic CHF,   Placed on supplemental [place] O2 and Given 40 Mg IV Lasix X1,  IV Solumedrol x1,  Albuterol Nebs, and Blood Cultures Done and placed on IV Rocephin and  Azithromycin  2.   Acute CHF-  Probably Diastolic,  Telemetry Monitoring,  Placed on Acute CHF Protocol and IV Lasix q 12 hrs x 4 doses wit supplemental KCl.   A 2D ECHO has been ordered for the AM.   Also started on Carvedilol and Lisinopril Rx.    3.   Acute Bronchitis-  On Chest X-ray, No Pneumonia seen but placed on IV Antibiotics for CAP due to Fever and clinical symptoms.  Also placed on Albuterol Nebs, and O2 PRN.     4.   Uncontrolled HTN-   Chronic, on No Meds at home,  Started on Meds for CHF which should also help control his HTN,  Monitor BPs.     5.   CAD- hx of PTCA with Stents,  Start ASA Rx.    6.   Tobacco-  Not Contemplative about Cessation at this time,  Nicotine patch while Inpatient.    7.  DVT Prophylaxis with Lovenox.        Code Status:   FULL CODE Family Communication:   Wife at Bedside Disposition Plan:    Inpatient  Time spent:   6060 Minutes  Arien Morine Velora HecklerC Jaquasha Carnevale [facility] Pager [phone]  If 7PM-7AM, please contact night-coverage [email or web address] Password [email or web address] 12/03/2013, 9:46 PM

## 2013-12-03 NOTE — ED Notes (Signed)
Per EMS: Pt c/o non-productive cough for the past week. Pt reports that he has also been wheezing. Pt was given 5 mg of albuterol treatment in route, pt reports that he feels better after the treatment. Pt is hypertensive, diagnosed two years ago and non-compliant with his medications. Pt is A/O x4.

## 2013-12-04 DIAGNOSIS — I517 Cardiomegaly: Secondary | ICD-10-CM

## 2013-12-04 LAB — CBC
HCT: 42.9 % (ref 39.0–52.0)
Hemoglobin: 13.6 g/dL (ref 13.0–17.0)
MCH: 29.6 pg (ref 26.0–34.0)
MCHC: 31.7 g/dL (ref 30.0–36.0)
MCV: 93.3 fL (ref 78.0–100.0)
Platelets: 211 10*3/uL (ref 150–400)
RBC: 4.6 MIL/uL (ref 4.22–5.81)
RDW: 14.4 % (ref 11.5–15.5)
WBC: 3.3 10*3/uL — AB (ref 4.0–10.5)

## 2013-12-04 LAB — BASIC METABOLIC PANEL
BUN: 14 mg/dL (ref 6–23)
CO2: 30 mEq/L (ref 19–32)
Calcium: 9.8 mg/dL (ref 8.4–10.5)
Chloride: 101 mEq/L (ref 96–112)
Creatinine, Ser: 1 mg/dL (ref 0.50–1.35)
GFR calc Af Amer: 87 mL/min — ABNORMAL LOW (ref >90)
GFR, EST NON AFRICAN AMERICAN: 75 mL/min — AB (ref >90)
Glucose, Bld: 139 mg/dL — ABNORMAL HIGH (ref 70–99)
POTASSIUM: 6.2 meq/L — AB (ref 3.7–5.3)
SODIUM: 141 meq/L (ref 137–147)

## 2013-12-04 LAB — POTASSIUM: POTASSIUM: 4.9 meq/L (ref 3.7–5.3)

## 2013-12-04 MED ORDER — LORATADINE 10 MG PO TABS
10.0000 mg | ORAL_TABLET | Freq: Every day | ORAL | Status: DC
  Administered 2013-12-04 – 2013-12-05 (×2): 10 mg via ORAL
  Filled 2013-12-04 (×2): qty 1

## 2013-12-04 MED ORDER — ALBUTEROL SULFATE (2.5 MG/3ML) 0.083% IN NEBU: 2.5000 mg | INHALATION_SOLUTION | RESPIRATORY_TRACT | Status: DC | PRN

## 2013-12-04 MED ORDER — ALBUTEROL SULFATE (2.5 MG/3ML) 0.083% IN NEBU: 2.5000 mg | INHALATION_SOLUTION | Freq: Four times a day (QID) | RESPIRATORY_TRACT | Status: DC | PRN

## 2013-12-04 MED ORDER — GUAIFENESIN ER 600 MG PO TB12
600.0000 mg | ORAL_TABLET | Freq: Two times a day (BID) | ORAL | Status: DC
  Administered 2013-12-04 – 2013-12-05 (×3): 600 mg via ORAL
  Filled 2013-12-04 (×4): qty 1

## 2013-12-04 MED ORDER — HYDROCODONE-HOMATROPINE 5-1.5 MG/5ML PO SYRP
5.0000 mL | ORAL_SOLUTION | ORAL | Status: DC | PRN
  Administered 2013-12-04 (×3): 5 mL via ORAL
  Filled 2013-12-04 (×3): qty 5

## 2013-12-04 NOTE — Plan of Care (Cosign Needed)
K up to 6.2 today which is a significant jump from yesterday when was 4.3-will check STAT K level- if remains high will need to dc Lasix,exogenous postassium and ACE I and give Kayexalate. In addition would need to provide cardiac protection with Ca Gluconate, insulin and d50 bolus.  Junious Silk, ANP

## 2013-12-04 NOTE — Progress Notes (Signed)
Noted K+level this am is at 6.2, was 4.3 yesterday. On call NP paged and received order to repeat Potassium level STAT. WIll follow up. Patient stable at this time. Denies, CP/SOB, in NSR at present.. Will cont to monitor.

## 2013-12-04 NOTE — Plan of Care (Signed)
Problem: Phase I Progression Outcomes Goal: EF % per last Echo/documented,Core Reminder form on chart Outcome: Completed/Met Date Met:  12/04/13 EF 20-25% per 2D Echo on 12/04/13

## 2013-12-04 NOTE — Progress Notes (Signed)
Echocardiogram 2D Echocardiogram has been performed.  Randy Walter 12/04/2013, 9:45 AM

## 2013-12-04 NOTE — Progress Notes (Signed)
[facility] PROGRESS NOTE  Randy Walter [id number] DOB: Feb 13, 1946 DOA: 12/03/2013 PCP: No primary provider on file.  Assessment/Plan: Acute Bronchitis -Will start weaning oxygen. -Continue azithromycin, but will DC rocephin as no signs of PNA on CXR. -2D ECHO was ordered. Results currently pending. -Will hold off on further lasix dosing as no h/o of CHF and no clinical evidence of such either.  CAD -Stable. No CP.  Tobacco Abuse -Not interested in cessation.  Code Status: Full Code Family Communication: patient only  Disposition Plan: Home when ready; likely 24-48 hours.   Consultants:  None   Antibiotics:  Azithromycin   Subjective: Runny nose and cough.  Objective: Filed Vitals:   12/04/13 0143 12/04/13 0200 12/04/13 0447 12/04/13 0541  BP:  155/81  160/90  Pulse:  78  70  Temp:  98.4 F (36.9 C)  98.3 F (36.8 C)  TempSrc:  Oral  Oral  Resp:  22  20  Height:      Weight:   79.4 kg (175 lb 0.7 oz)   SpO2: 96% 95%  98%    Intake/Output Summary (Last 24 hours) at 12/04/13 1204 Last data filed at 12/04/13 1029  Gross per 24 hour  Intake      0 ml  Output   2200 ml  Net  -2200 ml   Filed Weights   12/03/13 2203 12/04/13 0447  Weight: 79.561 kg (175 lb 6.4 oz) 79.4 kg (175 lb 0.7 oz)    Exam:   General:  AA Ox3, NAD  Cardiovascular: RRR  Respiratory: Bilateral ronchi  Abdomen: S/NT/ND/+BS  Extremities: no C/C/E   Neurologic:  Non-focal  Data Reviewed: Basic Metabolic Panel:  Recent Labs Lab 12/03/13 1935 12/04/13 0423 12/04/13 0630  NA 139 141  --   K 4.3 6.2* 4.9  CL 101 101  --   CO2 27 30  --   GLUCOSE 112* 139*  --   BUN 11 14  --   CREATININE 0.89 1.00  --   CALCIUM 9.3 9.8  --    Liver Function Tests: No results found for this basename: AST, ALT, ALKPHOS, BILITOT, PROT, ALBUMIN,  in the last 168 hours No results found for this basename: LIPASE, AMYLASE,  in the last 168 hours No results found for  this basename: AMMONIA,  in the last 168 hours CBC:  Recent Labs Lab 12/03/13 1935 12/04/13 0423  WBC 4.8 3.3*  NEUTROABS 3.6  --   HGB 13.4 13.6  HCT 41.6 42.9  MCV 93.1 93.3  PLT 179 211   Cardiac Enzymes:  Recent Labs Lab 12/03/13 1935  TROPONINI <0.30   BNP (last 3 results)  Recent Labs  12/03/13 1935  PROBNP 2274.0*   CBG: No results found for this basename: GLUCAP,  in the last 168 hours  No results found for this or any previous visit (from the past 240 hour(s)).   Studies: Dg Chest Port 1 View  12/03/2013   CLINICAL DATA:  Cough.  Chest pain.  EXAM: PORTABLE CHEST - 1 VIEW  COMPARISON:  Chest x-ray 09/09/2005.  FINDINGS: Diffuse peribronchial cuffing. No consolidative airspace disease. No pleural effusions. No evidence of pulmonary edema. Heart size is normal. Mediastinal contours are unremarkable. No pneumothorax.  IMPRESSION: 1. Diffuse peribronchial cuffing concerning for acute bronchitis.   Electronically Signed   By: Trudie Reed M.D.   On: 12/03/2013 19:38    Scheduled Meds: . albuterol  2.5 mg Nebulization Q6H  . azithromycin  500 mg Intravenous Q24H  . carvedilol  3.125 mg Oral BID WC  . cefTRIAXone (ROCEPHIN)  IV  1 g Intravenous Q24H  . enoxaparin (LOVENOX) injection  40 mg Subcutaneous QHS  . furosemide  40 mg Intravenous Q12H  . lisinopril  10 mg Oral Daily  . nicotine  14 mg Transdermal Daily  . potassium chloride  20 mEq Oral BID  . sodium chloride  3 mL Intravenous Q12H  . sodium chloride  3 mL Intravenous Q12H   Continuous Infusions:   Principal Problem:   SOB (shortness of breath) Active Problems:   COPD (chronic obstructive pulmonary disease)   Acute bronchitis   Uncontrolled hypertension   CAD (coronary artery disease)    Time spent: 35 minutes. Greater than 50% of this time was spent in direct contact with the patient coordinating care.    Randy Walter  [facility] Pager [phone]  If 7PM-7AM,  please contact night-coverage at [email or web address], password [email or web address] 12/04/2013, 12:04 PM  LOS: 1 day

## 2013-12-05 DIAGNOSIS — I5042 Chronic combined systolic (congestive) and diastolic (congestive) heart failure: Secondary | ICD-10-CM

## 2013-12-05 LAB — CBC
HCT: 43.6 % (ref 39.0–52.0)
Hemoglobin: 13.9 g/dL (ref 13.0–17.0)
MCH: 29 pg (ref 26.0–34.0)
MCHC: 31.9 g/dL (ref 30.0–36.0)
MCV: 91 fL (ref 78.0–100.0)
Platelets: 220 10*3/uL (ref 150–400)
RBC: 4.79 MIL/uL (ref 4.22–5.81)
RDW: 14.1 % (ref 11.5–15.5)
WBC: 4.8 10*3/uL (ref 4.0–10.5)

## 2013-12-05 LAB — BASIC METABOLIC PANEL
BUN: 22 mg/dL (ref 6–23)
CALCIUM: 9.8 mg/dL (ref 8.4–10.5)
CHLORIDE: 98 meq/L (ref 96–112)
CO2: 28 meq/L (ref 19–32)
Creatinine, Ser: 0.97 mg/dL (ref 0.50–1.35)
GFR calc Af Amer: >90 mL/min (ref >90)
GFR calc non Af Amer: 83 mL/min — ABNORMAL LOW (ref >90)
Glucose, Bld: 111 mg/dL — ABNORMAL HIGH (ref 70–99)
Potassium: 4.4 mEq/L (ref 3.7–5.3)
SODIUM: 138 meq/L (ref 137–147)

## 2013-12-05 MED ORDER — LORATADINE 10 MG PO TABS: 10.0000 mg | ORAL_TABLET | Freq: Every day | ORAL | Status: DC

## 2013-12-05 MED ORDER — ASPIRIN EC 81 MG PO TBEC: 81.0000 mg | DELAYED_RELEASE_TABLET | Freq: Every day | ORAL | Status: AC

## 2013-12-05 MED ORDER — FUROSEMIDE 40 MG PO TABS: 40.0000 mg | ORAL_TABLET | Freq: Every day | ORAL | Status: DC

## 2013-12-05 MED ORDER — GUAIFENESIN ER 600 MG PO TB12: 600.0000 mg | ORAL_TABLET | Freq: Two times a day (BID) | ORAL | Status: DC

## 2013-12-05 MED ORDER — AZITHROMYCIN 500 MG PO TABS: 500.0000 mg | ORAL_TABLET | ORAL | Status: DC

## 2013-12-05 MED ORDER — CARVEDILOL 3.125 MG PO TABS: 3.1250 mg | ORAL_TABLET | Freq: Two times a day (BID) | ORAL | Status: DC

## 2013-12-05 MED ORDER — ATORVASTATIN CALCIUM 10 MG PO TABS: 10.0000 mg | ORAL_TABLET | Freq: Every day | ORAL | Status: DC

## 2013-12-05 MED ORDER — LISINOPRIL 10 MG PO TABS: 10.0000 mg | ORAL_TABLET | Freq: Every day | ORAL | Status: DC

## 2013-12-05 MED ORDER — AZITHROMYCIN 500 MG PO TABS
500.0000 mg | ORAL_TABLET | ORAL | Status: DC
  Administered 2013-12-05: 500 mg via ORAL
  Filled 2013-12-05: qty 1

## 2013-12-05 NOTE — Progress Notes (Signed)
CARE MANAGEMENT NOTE 12/05/2013  Patient:  Randy Walter, Randy Walter   Account Number:  [id number]  Date Initiated:  12/04/2013  Documentation initiated by:  [facility]  Subjective/Objective Assessment:   hypoxia, acute bronchitis     Action/Plan:   waiting final recommendation for home   Anticipated DC Date:  12/05/2013   Anticipated DC Plan:  HOME/SELF CARE      DC Planning Services  CM consult      [facility] Choice  HOME HEALTH   Choice offered to / List presented to:  C-1 Patient        HH arranged  HH-1 RN      [facility] agency  [facility]   Status of service:  Completed, signed off Medicare Important Message given?  YES (If response is "NO", the following Medicare IM given date fields will be blank) Date Medicare IM given:  12/03/2013 Date Additional Medicare IM given:  12/05/2013  Discharge Disposition:  HOME W HOME HEALTH SERVICES  Per UR Regulation:    If discussed at Long Length of Stay Meetings, dates discussed:    Comments:  12/05/2013 1500 NCM spoke to wife on phone and gave info on [facility] RN. States pt does have coverage for his medications. Explained NCM will leave list of physicians with pt that accept Medicare to arrange for a PCP. Notified [facility] for new referral. They will assist pt with getting PCP. Isidoro Donning RN CCM Case Mgmt phone [phone]  12/05/2013 1000 NCM spoke to pt and offered choice for [facility]. Pt agreeable to [facility] for [facility]. States no DME needed for home. Pt states he does have scale at home. Educated to weigh daily. Reviewed heart healthy diet limiting sodium in his diet. Pt has Living Well with Heart Failure booklet. Addtional Medicare IM given, placed on chart. Isidoro Donning RN CCM Case Mgmt phone [phone]

## 2013-12-05 NOTE — Discharge Summary (Signed)
Physician Discharge Summary  Randy Walter [id number] DOB: 26-Nov-1945 DOA: 12/03/2013  PCP: No primary provider on file.  Admit date: 12/03/2013 Discharge date: 12/05/2013  Time spent: 45 minutes  Recommendations for Outpatient Follow-up:  -Will be discharged home today. -CM will assist with medications and with PCP follow up on DC. -Will need to follow up with new provider in 2 weeks.   Discharge Diagnoses:  Principal Problem:   SOB (shortness of breath) Active Problems:   COPD (chronic obstructive pulmonary disease)   Acute bronchitis   Uncontrolled hypertension   CAD (coronary artery disease)   Chronic combined systolic and diastolic CHF (congestive heart failure)   Discharge Condition: Stable and improved  Filed Weights   12/03/13 2203 12/04/13 0447 12/05/13 0512  Weight: 79.561 kg (175 lb 6.4 oz) 79.4 kg (175 lb 0.7 oz) 79.1 kg (174 lb 6.1 oz)    History of present illness:  Randy Walter is a 67 y.o. male with a history of CAD, HTN, and Tobacco Use who has not seen a PCP in 7 years who presents to the ED with complaints of fevers ,chills and cough and SOB worsening over the past week. He was hypoxic in the ED at a level of 87% in the ED and was placed on supplemental [place])2. He was found to have a Pro-BNP of 2274.0, and a Chest X-ray revealing peribronchial thickening consistent with Acute Bronchitis. He was started on therapy and referred for medical admission.    Hospital Course:   Acute Bronchitis  -No oxygen requirements. -Continue azithromycin for 3 more days on DC/mucinex BID.  Chronic Combined CHF -ECHO confirms EF of 20-25%. -Start lasix/coreg/ACE-I/ASA/Statin. -Does not have medical follow up. -Will ask CM to see for assistance. -No signs of volume overload at present.  CAD  -Stable. No CP.   Tobacco Abuse  -Not interested in cessation.   Procedures:  None   Consultations:  None  Discharge Instructions      Discharge Instructions     Diet - low sodium heart healthy    Complete by:  As directed      Discontinue IV    Complete by:  As directed      Increase activity slowly    Complete by:  As directed             Medication List         aspirin EC 81 MG tablet  Take 1 tablet (81 mg total) by mouth daily.     atorvastatin 10 MG tablet  Commonly known as:  LIPITOR  Take 1 tablet (10 mg total) by mouth daily.     azithromycin 500 MG tablet  Commonly known as:  ZITHROMAX  Take 1 tablet (500 mg total) by mouth daily.     carvedilol 3.125 MG tablet  Commonly known as:  COREG  Take 1 tablet (3.125 mg total) by mouth 2 (two) times daily with a meal.     furosemide 40 MG tablet  Commonly known as:  LASIX  Take 1 tablet (40 mg total) by mouth daily.     guaiFENesin 600 MG 12 hr tablet  Commonly known as:  MUCINEX  Take 1 tablet (600 mg total) by mouth 2 (two) times daily.     lisinopril 10 MG tablet  Commonly known as:  PRINIVIL,ZESTRIL  Take 1 tablet (10 mg total) by mouth daily.     loratadine 10 MG tablet  Commonly known as:  CLARITIN  Take  1 tablet (10 mg total) by mouth daily.       No Known Allergies Follow-up Information   Schedule an appointment as soon as possible for a visit in 2 weeks to follow up. (with new provider)        The results of significant diagnostics from this hospitalization (including imaging, microbiology, ancillary and laboratory) are listed below for reference.    Significant Diagnostic Studies: Dg Chest Port 1 View  12/03/2013   CLINICAL DATA:  Cough.  Chest pain.  EXAM: PORTABLE CHEST - 1 VIEW  COMPARISON:  Chest x-ray 09/09/2005.  FINDINGS: Diffuse peribronchial cuffing. No consolidative airspace disease. No pleural effusions. No evidence of pulmonary edema. Heart size is normal. Mediastinal contours are unremarkable. No pneumothorax.  IMPRESSION: 1. Diffuse peribronchial cuffing concerning for acute bronchitis.   Electronically Signed   By: Trudie Reedaniel  Entrikin M.D.    On: 12/03/2013 19:38    Microbiology: Recent Results (from the past 240 hour(s))  CULTURE, BLOOD (ROUTINE X 2)     Status: None   Collection Time    12/03/13  7:35 PM      Result Value Ref Range Status   Specimen Description BLOOD RIGHT ARM  [facility]   Final   Special Requests Normal   Final   Culture  Setup Time     Final   Value: 12/03/2013 22:39     [facility]   Culture     Final   Value:        BLOOD CULTURE RECEIVED NO GROWTH TO DATE CULTURE WILL BE HELD FOR 5 DAYS BEFORE ISSUING A FINAL NEGATIVE REPORT     [facility]formed at [facility]   Report Status PENDING   Incomplete  CULTURE, BLOOD (ROUTINE X 2)     Status: None   Collection Time    12/03/13  7:55 PM      Result Value Ref Range Status   Specimen Description BLOOD LEFT ARM   Final   Special Requests BOTTLES DRAWN AEROBIC AND ANAEROBIC 6CC   Final   Culture  Setup Time     Final   Value: 12/03/2013 22:39     [facility]formed at [facility]   Culture     Final   Value:        BLOOD CULTURE RECEIVED NO GROWTH TO DATE CULTURE WILL BE HELD FOR 5 DAYS BEFORE ISSUING A FINAL NEGATIVE REPORT     Performed at [facility]   Report Status PENDING   Incomplete     Labs: Basic Metabolic Panel:  Recent Labs Lab 12/03/13 1935 12/04/13 0423 12/04/13 0630 12/05/13 0412  NA 139 141  --  138  K 4.3 6.2* 4.9 4.4  CL 101 101  --  98  CO2 27 30  --  28  GLUCOSE 112* 139*  --  111*  BUN 11 14  --  22  CREATININE 0.89 1.00  --  0.97  CALCIUM 9.3 9.8  --  9.8   Liver Function Tests: No results found for this basename: AST, ALT, ALKPHOS, BILITOT, PROT, ALBUMIN,  in the last 168 hours No results found for this basename: LIPASE, AMYLASE,  in the last 168 hours No results found for this basename: AMMONIA,  in the last 168 hours CBC:  Recent Labs Lab 12/03/13 1935 12/04/13 0423 12/05/13 0412  WBC 4.8 3.3* 4.8  NEUTROABS 3.6  --   --   HGB 13.4 13.6  13.9  HCT 41.6 42.9 43.6   MCV 93.1 93.3 91.0  PLT 179 211 220   Cardiac Enzymes:  Recent Labs Lab 12/03/13 1935  TROPONINI <0.30   BNP: BNP (last 3 results)  Recent Labs  12/03/13 1935  PROBNP 2274.0*   CBG: No results found for this basename: GLUCAP,  in the last 168 hours     Signed:  Henderson Cloud  [facility] Pager: [phone] 12/05/2013, 1:50 PM

## 2013-12-09 LAB — CULTURE, BLOOD (ROUTINE X 2)
CULTURE: NO GROWTH
CULTURE: NO GROWTH
Special Requests: NORMAL

## 2014-08-24 ENCOUNTER — Other Ambulatory Visit (HOSPITAL_COMMUNITY): Payer: Self-pay | Admitting: Cardiology

## 2014-08-24 DIAGNOSIS — R079 Chest pain, unspecified: Secondary | ICD-10-CM

## 2014-09-02 ENCOUNTER — Encounter (HOSPITAL_COMMUNITY)
Admission: RE | Admit: 2014-09-02 | Discharge: 2014-09-02 | Disposition: A | Discharge status: Home / Self Care | Payer: Medicare Other | Source: Ambulatory Visit | Attending: Cardiology | Admitting: Cardiology

## 2014-09-02 DIAGNOSIS — R079 Chest pain, unspecified: Secondary | ICD-10-CM | POA: Insufficient documentation

## 2014-09-02 MED ORDER — REGADENOSON 0.4 MG/5ML IV SOLN
INTRAVENOUS | Status: AC
  Filled 2014-09-02: qty 5

## 2014-09-02 MED ORDER — REGADENOSON 0.4 MG/5ML IV SOLN
INTRAVENOUS | Status: AC
  Administered 2014-09-02: 0.4 mg
  Filled 2014-09-02: qty 5

## 2014-09-02 MED ORDER — TECHNETIUM TC 99M SESTAMIBI GENERIC - CARDIOLITE
10.0000 | Freq: Once | INTRAVENOUS | Status: AC | PRN
  Administered 2014-09-02: 10 via INTRAVENOUS

## 2014-09-02 MED ORDER — TECHNETIUM TC 99M SESTAMIBI GENERIC - CARDIOLITE
30.0000 | Freq: Once | INTRAVENOUS | Status: AC | PRN
  Administered 2014-09-02: 30 via INTRAVENOUS

## 2015-02-05 ENCOUNTER — Encounter (HOSPITAL_COMMUNITY): Payer: Self-pay | Admitting: Family Medicine

## 2015-02-05 ENCOUNTER — Emergency Department (HOSPITAL_COMMUNITY): Payer: Medicare Other

## 2015-02-05 ENCOUNTER — Observation Stay (HOSPITAL_COMMUNITY)
Admission: EM | Admit: 2015-02-05 | Discharge: 2015-02-07 | Disposition: A | Discharge status: Home / Self Care | Payer: Medicare Other | Attending: Internal Medicine | Admitting: Internal Medicine

## 2015-02-05 DIAGNOSIS — I2510 Atherosclerotic heart disease of native coronary artery without angina pectoris: Secondary | ICD-10-CM | POA: Diagnosis not present

## 2015-02-05 DIAGNOSIS — Z7982 Long term (current) use of aspirin: Secondary | ICD-10-CM | POA: Insufficient documentation

## 2015-02-05 DIAGNOSIS — J449 Chronic obstructive pulmonary disease, unspecified: Secondary | ICD-10-CM | POA: Insufficient documentation

## 2015-02-05 DIAGNOSIS — Z955 Presence of coronary angioplasty implant and graft: Secondary | ICD-10-CM | POA: Insufficient documentation

## 2015-02-05 DIAGNOSIS — E785 Hyperlipidemia, unspecified: Secondary | ICD-10-CM | POA: Insufficient documentation

## 2015-02-05 DIAGNOSIS — M1990 Unspecified osteoarthritis, unspecified site: Secondary | ICD-10-CM | POA: Diagnosis not present

## 2015-02-05 DIAGNOSIS — R55 Syncope and collapse: Secondary | ICD-10-CM | POA: Diagnosis not present

## 2015-02-05 DIAGNOSIS — I10 Essential (primary) hypertension: Secondary | ICD-10-CM | POA: Diagnosis present

## 2015-02-05 DIAGNOSIS — J45909 Unspecified asthma, uncomplicated: Secondary | ICD-10-CM | POA: Insufficient documentation

## 2015-02-05 DIAGNOSIS — N179 Acute kidney failure, unspecified: Secondary | ICD-10-CM

## 2015-02-05 DIAGNOSIS — I5042 Chronic combined systolic (congestive) and diastolic (congestive) heart failure: Secondary | ICD-10-CM | POA: Diagnosis not present

## 2015-02-05 DIAGNOSIS — R42 Dizziness and giddiness: Secondary | ICD-10-CM | POA: Diagnosis not present

## 2015-02-05 DIAGNOSIS — R0602 Shortness of breath: Secondary | ICD-10-CM | POA: Diagnosis not present

## 2015-02-05 DIAGNOSIS — F17210 Nicotine dependence, cigarettes, uncomplicated: Secondary | ICD-10-CM | POA: Insufficient documentation

## 2015-02-05 LAB — CBC
HCT: 36.7 % — ABNORMAL LOW (ref 39.0–52.0)
Hemoglobin: 11.9 g/dL — ABNORMAL LOW (ref 13.0–17.0)
MCH: 28.8 pg (ref 26.0–34.0)
MCHC: 32.4 g/dL (ref 30.0–36.0)
MCV: 88.9 fL (ref 78.0–100.0)
PLATELETS: 207 10*3/uL (ref 150–400)
RBC: 4.13 MIL/uL — AB (ref 4.22–5.81)
RDW: 14.4 % (ref 11.5–15.5)
WBC: 7.5 10*3/uL (ref 4.0–10.5)

## 2015-02-05 LAB — URINALYSIS, ROUTINE W REFLEX MICROSCOPIC
BILIRUBIN URINE: NEGATIVE
GLUCOSE, UA: NEGATIVE mg/dL
Hgb urine dipstick: NEGATIVE
Ketones, ur: NEGATIVE mg/dL
LEUKOCYTES UA: NEGATIVE
NITRITE: NEGATIVE
Protein, ur: NEGATIVE mg/dL
Specific Gravity, Urine: 1.011 (ref 1.005–1.030)
Urobilinogen, UA: 1 mg/dL (ref 0.0–1.0)
pH: 6 (ref 5.0–8.0)

## 2015-02-05 LAB — BASIC METABOLIC PANEL
Anion gap: 6 (ref 5–15)
BUN: 21 mg/dL — AB (ref 6–20)
CO2: 29 mmol/L (ref 22–32)
Calcium: 9 mg/dL (ref 8.9–10.3)
Chloride: 106 mmol/L (ref 101–111)
Creatinine, Ser: 1.59 mg/dL — ABNORMAL HIGH (ref 0.61–1.24)
GFR, EST AFRICAN AMERICAN: 49 mL/min — AB (ref >60)
GFR, EST NON AFRICAN AMERICAN: 43 mL/min — AB (ref >60)
Glucose, Bld: 116 mg/dL — ABNORMAL HIGH (ref 65–99)
Potassium: 4.5 mmol/L (ref 3.5–5.1)
SODIUM: 141 mmol/L (ref 135–145)

## 2015-02-05 LAB — TROPONIN I: Troponin I: <0.03 ng/mL (ref <0.031)

## 2015-02-05 LAB — CBG MONITORING, ED: Glucose-Capillary: 86 mg/dL (ref 65–99)

## 2015-02-05 LAB — TSH: TSH: 3.357 u[IU]/mL (ref 0.350–4.500)

## 2015-02-05 LAB — BRAIN NATRIURETIC PEPTIDE: B Natriuretic Peptide: 15.4 pg/mL (ref 0.0–100.0)

## 2015-02-05 MED ORDER — ATORVASTATIN CALCIUM 10 MG PO TABS
10.0000 mg | ORAL_TABLET | Freq: Every day | ORAL | Status: DC
  Administered 2015-02-06: 10 mg via ORAL
  Filled 2015-02-05 (×2): qty 1

## 2015-02-05 MED ORDER — GUAIFENESIN ER 600 MG PO TB12
600.0000 mg | ORAL_TABLET | Freq: Two times a day (BID) | ORAL | Status: DC
  Administered 2015-02-05 – 2015-02-07 (×4): 600 mg via ORAL
  Filled 2015-02-05 (×5): qty 1

## 2015-02-05 MED ORDER — ENOXAPARIN SODIUM 40 MG/0.4ML ~~LOC~~ SOLN
40.0000 mg | SUBCUTANEOUS | Status: DC
  Administered 2015-02-05 – 2015-02-06 (×2): 40 mg via SUBCUTANEOUS
  Filled 2015-02-05 (×3): qty 0.4

## 2015-02-05 MED ORDER — LORATADINE 10 MG PO TABS
10.0000 mg | ORAL_TABLET | Freq: Every day | ORAL | Status: DC
  Administered 2015-02-06 – 2015-02-07 (×2): 10 mg via ORAL
  Filled 2015-02-05 (×2): qty 1

## 2015-02-05 MED ORDER — ASPIRIN EC 81 MG PO TBEC
81.0000 mg | DELAYED_RELEASE_TABLET | Freq: Every day | ORAL | Status: DC
  Administered 2015-02-05 – 2015-02-07 (×3): 81 mg via ORAL
  Filled 2015-02-05 (×3): qty 1

## 2015-02-05 MED ORDER — ACETAMINOPHEN 325 MG PO TABS: 650.0000 mg | ORAL_TABLET | Freq: Four times a day (QID) | ORAL | Status: DC | PRN

## 2015-02-05 MED ORDER — LISINOPRIL 10 MG PO TABS
10.0000 mg | ORAL_TABLET | Freq: Every day | ORAL | Status: DC
  Administered 2015-02-05: 10 mg via ORAL
  Filled 2015-02-05 (×2): qty 1

## 2015-02-05 MED ORDER — SODIUM CHLORIDE 0.9 % IV BOLUS (SEPSIS)
1000.0000 mL | Freq: Once | INTRAVENOUS | Status: AC
  Administered 2015-02-05: 1000 mL via INTRAVENOUS

## 2015-02-05 MED ORDER — SODIUM CHLORIDE 0.9 % IV SOLN
INTRAVENOUS | Status: DC
  Administered 2015-02-05: 20:00:00 via INTRAVENOUS

## 2015-02-05 MED ORDER — CARVEDILOL 3.125 MG PO TABS
3.1250 mg | ORAL_TABLET | Freq: Two times a day (BID) | ORAL | Status: DC
Start: 2015-02-05 — End: 2015-02-06
  Administered 2015-02-05: 3.125 mg via ORAL
  Filled 2015-02-05 (×2): qty 1

## 2015-02-05 MED ORDER — ACETAMINOPHEN 650 MG RE SUPP: 650.0000 mg | Freq: Four times a day (QID) | RECTAL | Status: DC | PRN

## 2015-02-05 NOTE — ED Provider Notes (Signed)
History   Chief Complaint  Patient presents with  . Loss of Consciousness    HPI  Randy Walter is a 69 y.o. male with PMH as below notable for uncontrolled HTN, CAD s/p stend (yrs ago), CHF (11/2014 EF 20-25%), COPD who presents to ED with c/o near syncope.  Pt reports he was at funeral today. Was in church which was hot and very crowded. Pt was on his feet for a long time (at least 20 minutes) and began feeling sweaty and lightheaded. Denies pain, SOB, N/V. Says yesterday felt similar while cutting grass and again without other sxs. Pt says he normally can cut grass without issues; also notes it was very hot. Prior to this pt was in usual state of health. Reports mild nonproductive cough for last 1 wks. Denies f/c.  Pt reports feeling normal now.  Onset of symptoms: gradual. Duration 1 hrs.  Modifying factors none.  Severity: moderate.  Associated symptoms: as above.  Hx of similar symptoms: no    Past medical/surgical history, social history, medications, allergies and FH have been reviewed with patient and/or in documentation. Furthermore, if pt family or friend(s) present, additional historical information was obtained from them.  Past Medical History  Diagnosis Date  . Hypertension   . Arthritis   . CAD (coronary artery disease)   . Tobacco use    Past Surgical History  Procedure Laterality Date  . Coronary stent placement     Family History  Problem Relation Age of Onset  . CAD Maternal Grandmother    History  Substance Use Topics  . Smoking status: Current Every Day Smoker -- 0.50 packs/day for 60 years    Types: Cigarettes  . Smokeless tobacco: Never Used  . Alcohol Use: Yes     Comment: 12/03/13 - "none in 2 weeks"     Review of Systems Constitutional: - F/C, -fatigue. + diaphoresis HENT: - congestion, -rhinorrhea, -sore throat.   Eyes: - eye pain, -visual disturbance.  Respiratory: - cough, -SOB, -hemoptysis.   Cardiovascular: - CP, -palps.   Gastrointestinal: - N/V/D, -abd pain  Genitourinary: - flank pain, -dysuria, -frequency.  Musculoskeletal: - myalgia/arthritis, -joint swelling, -gait abnormality, -back pain, -neck pain/stiffness, -leg pain/swelling.  Skin: - rash/lesion.  Neurological: - focal weakness, +lightheadedness, -dizziness, -numbness, -HA.  All other systems reviewed and are negative.   Physical Exam  Physical Exam  ED Triage Vitals  Enc Vitals Group     BP 02/05/15 1445 115/73 mmHg     Pulse Rate 02/05/15 1445 61     Resp 02/05/15 1445 17     Temp 02/05/15 1429 97.4 F (36.3 C)     Temp Source 02/05/15 1429 Oral     SpO2 02/05/15 1424 100 %     Weight --      Height --      Head Cir --      Peak Flow --      Pain Score 02/05/15 1430 0     Pain Loc --      Pain Edu? --      Excl. in GC? --     Constitutional: Patient is well appearing and in no acute distress Head: Normocephalic and atraumatic.  Eyes: Extraocular motion intact, no scleral icterus Mouth: MMM, OP clear Neck: Supple without meningismus, mass, or overt JVD Respiratory: No respiratory distress. Normal WOB. No w/r/g. CV: RRR, no obvious murmurs.  Pulses +2 and symmetric. Euvolemic Abdomen: Soft, NT, ND, no r/g. No mass.  MSK: Extremities are atraumatic without deformity, ROM intact Skin: Warm, dry, intact without rash Neuro: HDS, AAOx4. PERRL, EOMI, TML, face sym. CN 2-12 grossly intact. 5/5 sym, no drift, SILT, normal gait and coordination.  ED Course  Procedures   Labs Reviewed  BASIC METABOLIC PANEL - Abnormal; Notable for the following:    Glucose, Bld 116 (*)    BUN 21 (*)    Creatinine, Ser 1.59 (*)    GFR calc non Af Amer 43 (*)    GFR calc Af Amer 49 (*)    All other components within normal limits  CBC - Abnormal; Notable for the following:    RBC 4.13 (*)    Hemoglobin 11.9 (*)    HCT 36.7 (*)    All other components within normal limits  TROPONIN I  BRAIN NATRIURETIC PEPTIDE  URINALYSIS, ROUTINE W REFLEX  MICROSCOPIC (NOT AT [facility])  CBG MONITORING, ED   I personally reviewed and interpreted all labs.  Dg Chest 2 View  02/05/2015   CLINICAL DATA:  Per ED note: Pt presents from church with c/o syncopal episode. Pt had witnessed full syncopal episode. Patient was extremely diaphoretic -- soaked through his shirt, and was cool to the touch on EMS arrival. Pt is A&Ox4, in NAD.  EXAM: CHEST  2 VIEW  COMPARISON:  12/03/2013  FINDINGS: Cardiac silhouette is normal in size and configuration. The aorta is mildly uncoiled. No mediastinal or hilar masses or evidence adenopathy.  Lungs show interstitial prominence, but no consolidation or convincing edema. Peribronchial cuffing noted on the prior study is less prominent currently.  No pleural effusion or pneumothorax.  Bony thorax is unremarkable.  IMPRESSION: No acute cardiopulmonary disease.   Electronically Signed   By: Amie Portland M.D.   On: 02/05/2015 16:30   I personally viewed above image(s) which were used in my medical decision making. Formal interpretations by Radiology.   EKG Interpretation  Date/Time:  Sunday February 05 2015 14:26:43 EDT Ventricular Rate:  62 PR Interval:  258 QRS Duration: 94 QT Interval:  432 QTC Calculation: 439 R Axis:   27 Text Interpretation:  Sinus rhythm Prolonged PR interval Anteroseptal infarct, old No significant change was found Confirmed by CAMPOS  MD, Caryn Bee ([id number]) on 02/05/2015 2:37:36 PM       MDM: Kennon Holter is a 69 y.o. male with H&P as above who p/w CC: presyncope  No family h/o sudden cardiac death, h/o CAD/MI, anemia/dark stools, CP/SOB/palpitations, HA, abd pain, seizure activity, weakness.   Exam without signs of conjunctival pallor, head injury. Abd soft, ND, no pulsatile mass.   -Initial impression:  -Ordered: EKG, screening labs, CXR.  EKG shows NSR, slight downsloping ST segments in inf leads which appears new. No reciprocal changes. No TWIs. When compared to old EKG from last year. Similar  changes on stress test which was nondiagnostic.  -Results: W/u notable for mild AKI. IVF given. W/u o/w unremarkable  -Re-evaluation: remains HDS, NAD  Fails [facility] Syncope rule for h/o CHF. C - CHF hx H - Hct <30 E - Abnormal EKG - No signs of WPW, Brugada, Long QT, HOCM S - SOB S- Systolic < 90  No ectopy on monitor. No syncope in ED.   Old records reviewed (if available). Labs and imaging reviewed personally by myself and considered in medical decision making if ordered.   Clinical Impression: 1. Near syncope    Disposition: Admit  Condition: stable  I have discussed the results, Dx and  Tx plan with the pt(& family if present). He/she/they expressed understanding and agree(s) with the plan.  Pt seen in conjunction with Dr. Dione Booze, MD  Ames Dura, DO [facility] Emergency Medicine Resident - PGY-3    Ames Dura, MD 02/06/15 1610  Dione Booze, MD 02/06/15 2259

## 2015-02-05 NOTE — ED Notes (Signed)
Patient transported to X-ray 

## 2015-02-05 NOTE — ED Notes (Signed)
Pt presents from church with c/o syncopal episode. Pt had witnessed full syncopal episode.  Patient was extremely diaphoretic -- soaked through his shirt, and was cool to the touch on EMS arrival.  Pt is A&Ox4, in NAD. Received NS PTA.

## 2015-02-05 NOTE — ED Provider Notes (Signed)
MSE was initiated and I personally evaluated the patient and placed orders (if any) at  2:37 PM on February 05, 2015.  The patient appears stable so that the remainder of the MSE may be completed by another provider.   EKG Interpretation  Date/Time:  Sunday February 05 2015 14:26:43 EDT Ventricular Rate:  62 PR Interval:  258 QRS Duration: 94 QT Interval:  432 QTC Calculation: 439 R Axis:   27 Text Interpretation:  Sinus rhythm Prolonged PR interval Anteroseptal infarct, old No significant change was found Confirmed by Semaja Lymon  MD, Caryn Bee ([id number]) on 02/05/2015 2:37:36 PM      Near syncope described by pt. Bystanders report syncope per EMS. Asymptomatic at this time. No CP or SOB. No preceding palpitations  Azalia Bilis, MD 02/05/15 1438

## 2015-02-05 NOTE — H&P (Signed)
PCP:   Rinaldo Cloud, MD   Chief Complaint:  Near-syncope  HPI:  69 year old male who  has a past medical history of Hypertension; Arthritis; CAD (coronary artery disease); and Tobacco use. today was brought to the hospital by EMS after patient had an episode of near-syncope while he wasn't feeling well at church. As per patient the chest was overcrowded and patient suddenly became very dizzy and started sweating. Though he did not pass out, he was feeling extremely weak and then sat on the chair. He denies chest pain or shortness of breath. Patient does have a history of CAD with coronary stent in place, patient also takes Lasix for congestive heart failure. Last echo from June 2015 shows both systolic and diastolic heart failure EF 20-25% him a diffuse hypokinesis. Patient says that he also had a similar episode while he was mowing his yard, he became extremely short of breath and sweaty. He denies nausea vomiting or diarrhea. No fever no dysuria urgency frequency of urination.    Allergies:  No Known Allergies    Past Medical History  Diagnosis Date  . Hypertension   . Arthritis   . CAD (coronary artery disease)   . Tobacco use     Past Surgical History  Procedure Laterality Date  . Coronary stent placement      Prior to Admission medications   Medication Sig Start Date End Date Taking? Authorizing Provider  aspirin EC 81 MG tablet Take 1 tablet (81 mg total) by mouth daily. 12/05/13   Henderson Cloud, MD  atorvastatin (LIPITOR) 10 MG tablet Take 1 tablet (10 mg total) by mouth daily. 12/05/13   Henderson Cloud, MD  azithromycin (ZITHROMAX) 500 MG tablet Take 1 tablet (500 mg total) by mouth daily. 12/05/13   Henderson Cloud, MD  carvedilol (COREG) 3.125 MG tablet Take 1 tablet (3.125 mg total) by mouth 2 (two) times daily with a meal. 12/05/13   Henderson Cloud, MD  furosemide (LASIX) 40 MG tablet Take 1 tablet (40 mg total) by mouth daily.  12/05/13   Henderson Cloud, MD  guaiFENesin (MUCINEX) 600 MG 12 hr tablet Take 1 tablet (600 mg total) by mouth 2 (two) times daily. 12/05/13   Henderson Cloud, MD  lisinopril (PRINIVIL,ZESTRIL) 10 MG tablet Take 1 tablet (10 mg total) by mouth daily. 12/05/13   Henderson Cloud, MD  loratadine (CLARITIN) 10 MG tablet Take 1 tablet (10 mg total) by mouth daily. 12/05/13   Henderson Cloud, MD    Social History:  reports that he has been smoking Cigarettes.  He has a 30 pack-year smoking history. He has never used smokeless tobacco. He reports that he drinks alcohol. He reports that he does not use illicit drugs.  Family History  Problem Relation Age of Onset  . CAD Maternal Grandmother     There were no vitals filed for this visit.  All the positives are listed in BOLD  Review of Systems:  HEENT: Headache, blurred vision, runny nose, sore throat Neck: Hypothyroidism, hyperthyroidism,,lymphadenopathy Chest : Shortness of breath, history of COPD, Asthma Heart : Chest pain, history of coronary arterey disease GI:  Nausea, vomiting, diarrhea, constipation, GERD GU: Dysuria, urgency, frequency of urination, hematuria Neuro: Stroke, seizures, syncope Psych: Depression, anxiety, hallucinations   Physical Exam: Blood pressure 166/78, pulse 66, temperature 97.4 F (36.3 C), temperature source Oral, resp. rate 16, SpO2 97 %. Constitutional:   Patient  is a well-developed and well-nourished male in no acute distress and cooperative with exam. Head: Normocephalic and atraumatic Mouth: Mucus membranes moist Eyes: PERRL, EOMI, conjunctivae normal Neck: Supple, No Thyromegaly Cardiovascular: RRR, S1 normal, S2 normal Pulmonary/Chest: CTAB, no wheezes, rales, or rhonchi Abdominal: Soft. Non-tender, non-distended, bowel sounds are normal, no masses, organomegaly, or guarding present.  Neurological: A&O x3, Strength is normal and symmetric bilaterally, cranial nerve  II-XII are grossly intact, no focal motor deficit, sensory intact to light touch bilaterally.  Extremities : No Cyanosis, Clubbing or Edema  Labs on Admission:  Basic Metabolic Panel:  Recent Labs Lab 02/05/15 1555  NA 141  K 4.5  CL 106  CO2 29  GLUCOSE 116*  BUN 21*  CREATININE 1.59*  CALCIUM 9.0   Liver Function Tests: No results for input(s): AST, ALT, ALKPHOS, BILITOT, PROT, ALBUMIN in the last 168 hours. No results for input(s): LIPASE, AMYLASE in the last 168 hours. No results for input(s): AMMONIA in the last 168 hours. CBC:  Recent Labs Lab 02/05/15 1555  WBC 7.5  HGB 11.9*  HCT 36.7*  MCV 88.9  PLT 207   Cardiac Enzymes:  Recent Labs Lab 02/05/15 1555  TROPONINI <0.03    BNP (last 3 results)  Recent Labs  02/05/15 1555  BNP 15.4    ProBNP (last 3 results) No results for input(s): PROBNP in the last 8760 hours.  CBG:  Recent Labs Lab 02/05/15 1532  GLUCAP 86    Radiological Exams on Admission: Dg Chest 2 View  02/05/2015   CLINICAL DATA:  Per ED note: Pt presents from church with c/o syncopal episode. Pt had witnessed full syncopal episode. Patient was extremely diaphoretic -- soaked through his shirt, and was cool to the touch on EMS arrival. Pt is A&Ox4, in NAD.  EXAM: CHEST  2 VIEW  COMPARISON:  12/03/2013  FINDINGS: Cardiac silhouette is normal in size and configuration. The aorta is mildly uncoiled. No mediastinal or hilar masses or evidence adenopathy.  Lungs show interstitial prominence, but no consolidation or convincing edema. Peribronchial cuffing noted on the prior study is less prominent currently.  No pleural effusion or pneumothorax.  Bony thorax is unremarkable.  IMPRESSION: No acute cardiopulmonary disease.   Electronically Signed   By: Amie Portland M.D.   On: 02/05/2015 16:30    EKG: Independently reviewed. Normal sinus rhythm, ST depression in 2,3, aVF   Assessment/Plan Active Problems:   Near syncope   coronary artery  disease     Near-syncope  We'll admit the patient under observation in telemetry, obtain serial cardiac enzymes. Check echocardiogram in a.m. Check TSH, orthostatic vital signs  Acute kidney injury Patient has acute kidney injury from diuresis. Today creatinine is 1.59, baseline is 1.30 Will hold Lasix and do gentle IV hydration with normal saline at 50 mL per hour  Coronary artery disease Continue aspirin, Coreg, Lipitor  Hypertension Continue Coreg, lisinopril  Code status: Full code  Family discussion: No family present at bedside  Time Spent on Admission: 60 minutes  LAMA,GAGAN S [facility] Pager: [phone] 02/05/2015, 6:02 PM  If 7PM-7AM, please contact night-coverage  [email or web address]  Password [email or web address]

## 2015-02-06 DIAGNOSIS — N179 Acute kidney failure, unspecified: Secondary | ICD-10-CM | POA: Diagnosis not present

## 2015-02-06 DIAGNOSIS — R55 Syncope and collapse: Secondary | ICD-10-CM

## 2015-02-06 LAB — BASIC METABOLIC PANEL
Anion gap: 5 (ref 5–15)
BUN: 15 mg/dL (ref 6–20)
CHLORIDE: 107 mmol/L (ref 101–111)
CO2: 29 mmol/L (ref 22–32)
Calcium: 9.1 mg/dL (ref 8.9–10.3)
Creatinine, Ser: 1.18 mg/dL (ref 0.61–1.24)
GFR calc Af Amer: >60 mL/min (ref >60)
Glucose, Bld: 118 mg/dL — ABNORMAL HIGH (ref 65–99)
Potassium: 4.4 mmol/L (ref 3.5–5.1)
Sodium: 141 mmol/L (ref 135–145)

## 2015-02-06 LAB — TROPONIN I
Troponin I: <0.03 ng/mL (ref <0.031)
Troponin I: <0.03 ng/mL (ref <0.031)

## 2015-02-06 MED ORDER — CARVEDILOL 3.125 MG PO TABS
3.1250 mg | ORAL_TABLET | Freq: Two times a day (BID) | ORAL | Status: DC
  Administered 2015-02-06 – 2015-02-07 (×3): 3.125 mg via ORAL
  Filled 2015-02-06 (×5): qty 1

## 2015-02-06 NOTE — Progress Notes (Signed)
**Randy Randy** Randy Randy  Randy Randy IHK:[id number] DOB: October 14, 1945 DOA: 02/05/2015 PCP: [name], MD  Assessment/Plan: 1-near syncope;  Orthostatic vs arrhythmia Continue to monitor on telemetry.  Enzymes negative.  ECHO ordered.  Cardiology consulted.  Hold diuretics.  NSL   2-Acute kidney injury Holding diuretics. And ACE. Repeat B-met.   Coronary artery disease Continue aspirin, Coreg, Lipitor  Hypertension Continue Coreg.   Code Status: full code.  Family Communication: care discussed with patient  Disposition Plan: home in 24 hours.    Consultants:  cardiology  Procedures:  none  Antibiotics:  none  HPI/Subjective: Feeling better, denies chest pain. He saw his cardiologist last week.   Objective: Filed Vitals:   02/06/15 0531  BP: 152/80  Pulse: 68  Temp: 98.1 F (36.7 C)  Resp: 18    Intake/Output Summary (Last 24 hours) at 02/06/15 1512 Last data filed at 02/06/15 1100  Gross per 24 hour  Intake    530 ml  Output   1675 ml  Net  -1145 ml   Filed Weights   02/05/15 1958 02/06/15 0531  Weight: 82.691 kg (182 lb 4.8 oz) 83.462 kg (184 lb)    Exam:   General:  NAD  Cardiovascular: S 1, S 2 RRR  Respiratory: CTA  Abdomen: BS present, soft, nt  Musculoskeletal: no edema   Data Reviewed: Basic Metabolic Panel:  Recent Labs Lab 02/05/15 1555 02/06/15 0736  NA 141 141  K 4.5 4.4  CL 106 107  CO2 29 29  GLUCOSE 116* 118*  BUN 21* 15  CREATININE 1.59* 1.18  CALCIUM 9.0 9.1   Liver Function Tests: No results for input(s): AST, ALT, ALKPHOS, BILITOT, PROT, ALBUMIN in the last 168 hours. No results for input(s): LIPASE, AMYLASE in the last 168 hours. No results for input(s): AMMONIA in the last 168 hours. CBC:  Recent Labs Lab 02/05/15 1555  WBC 7.5  HGB 11.9*  HCT 36.7*  MCV 88.9  PLT 207   Cardiac Enzymes:  Recent Labs Lab 02/05/15 1555 02/05/15 2013 02/06/15 0146 02/06/15 0736  TROPONINI  <0.03 <0.03 <0.03 <0.03   BNP (last 3 results)  Recent Labs  02/05/15 1555  BNP 15.4    ProBNP (last 3 results) No results for input(s): PROBNP in the last 8760 hours.  CBG:  Recent Labs Lab 02/05/15 1532  GLUCAP 86    No results found for this or any previous visit (from the past 240 hour(s)).   Studies: Dg Chest 2 View  02/05/2015   CLINICAL DATA:  Per ED Randy: Pt presents from church with c/o syncopal episode. Pt had witnessed full syncopal episode. Patient was extremely diaphoretic -- soaked through his shirt, and was cool to the touch on EMS arrival. Pt is A&Ox4, in NAD.  EXAM: CHEST  2 VIEW  COMPARISON:  12/03/2013  FINDINGS: Cardiac silhouette is normal in size and configuration. The aorta is mildly uncoiled. No mediastinal or hilar masses or evidence adenopathy.  Lungs show interstitial prominence, but no consolidation or convincing edema. Peribronchial cuffing noted on the prior study is less prominent currently.  No pleural effusion or pneumothorax.  Bony thorax is unremarkable.  IMPRESSION: No acute cardiopulmonary disease.   Electronically Signed   By: Lajean Manes M.D.   On: 02/05/2015 16:30    Scheduled Meds: . aspirin EC  81 mg Oral Daily  . atorvastatin  10 mg Oral q1800  . carvedilol  3.125 mg Oral BID WC  . enoxaparin (LOVENOX) injection  40 mg Subcutaneous Q24H  . guaiFENesin  600 mg Oral BID  . loratadine  10 mg Oral Daily   Continuous Infusions:   Active Problems:   Uncontrolled hypertension   CAD (coronary artery disease)   Chronic combined systolic and diastolic CHF (congestive heart failure)   Near syncope   AKI (acute kidney injury)    Time spent: 35 minutes    Lavora Brisbon, [facility] Pager [phone]. If 7PM-7AM, please contact night-coverage at [email or web address], password [email or web address] 02/06/2015, 3:12 PM

## 2015-02-06 NOTE — Consult Note (Signed)
Reason for Consult: Dizziness/near syncope Referring Physician: [facility]  Randy Walter is an 69 y.o. male.  HPI: Patient is 69 year old male with past medical history significant for coronary artery disease history of PTCA stenting to mid LAD in 2007, hypertension, history of congestive heart failure secondary to systolic/diastolic dysfunction, hypercholesteremia, tobacco abuse, COPD, history of CVA, was admitted yesterday because of near syncopal episode associated with dizziness and diaphoresis file in the church. Patient states it was very hard in the church and had sudden episode of diaphoresis associated with feeling dizzy and weak. Denies any palpitation lightheadedness or syncopal episode. Denies chest pain. Denies nausea vomiting. Patient states he had similar episode while moving his yard associated with shortness of breath. Patient was recently seen in my office stopped taking Entresto as he could not afford it, and was started on edarbyclor 40/12.5 mg daily. Patient had nuclear stress test a few months ago that showed no evidence of ischemia with EF of 44% which has improved as compared to prior echo done last year.  Past Medical History  Diagnosis Date  . Hypertension   . Arthritis   . CAD (coronary artery disease)   . Tobacco use     Past Surgical History  Procedure Laterality Date  . Coronary stent placement      Family History  Problem Relation Age of Onset  . CAD Maternal Grandmother     Social History:  reports that he has been smoking Cigarettes.  He has a 30 pack-year smoking history. He has never used smokeless tobacco. He reports that he drinks alcohol. He reports that he does not use illicit drugs.  Allergies: No Known Allergies  Medications: I have reviewed the patient's current medications.  Results for orders placed or performed during the hospital encounter of 02/05/15 (from the past 48 hour(s))  CBG monitoring, ED     Status: None   Collection  Time: 02/05/15  3:32 PM  Result Value Ref Range   Glucose-Capillary 86 65 - 99 mg/dL  Basic metabolic panel     Status: Abnormal   Collection Time: 02/05/15  3:55 PM  Result Value Ref Range   Sodium 141 135 - 145 mmol/L   Potassium 4.5 3.5 - 5.1 mmol/L   Chloride 106 101 - 111 mmol/L   CO2 29 22 - 32 mmol/L   Glucose, Bld 116 (H) 65 - 99 mg/dL   BUN 21 (H) 6 - 20 mg/dL   Creatinine, Ser 1.59 (H) 0.61 - 1.24 mg/dL   Calcium 9.0 8.9 - 10.3 mg/dL   GFR calc non Af Amer 43 (L) >60 mL/min   GFR calc Af Amer 49 (L) >60 mL/min    Comment: (NOTE) The eGFR has been calculated using the CKD EPI equation. This calculation has not been validated in all clinical situations. eGFR's persistently <60 mL/min signify possible Chronic Kidney Disease.    Anion gap 6 5 - 15  CBC     Status: Abnormal   Collection Time: 02/05/15  3:55 PM  Result Value Ref Range   WBC 7.5 4.0 - 10.5 K/uL   RBC 4.13 (L) 4.22 - 5.81 MIL/uL   Hemoglobin 11.9 (L) 13.0 - 17.0 g/dL   HCT 36.7 (L) 39.0 - 52.0 %   MCV 88.9 78.0 - 100.0 fL   MCH 28.8 26.0 - 34.0 pg   MCHC 32.4 30.0 - 36.0 g/dL   RDW 14.4 11.5 - 15.5 %   Platelets 207 150 - 400 K/uL  Troponin  I     Status: None   Collection Time: 02/05/15  3:55 PM  Result Value Ref Range   Troponin I <0.03 <0.031 ng/mL    Comment:        NO INDICATION OF MYOCARDIAL INJURY.   Brain natriuretic peptide     Status: None   Collection Time: 02/05/15  3:55 PM  Result Value Ref Range   B Natriuretic Peptide 15.4 0.0 - 100.0 pg/mL  Urinalysis, Routine w reflex microscopic (not at [facility])     Status: None   Collection Time: 02/05/15  5:30 PM  Result Value Ref Range   Color, Urine YELLOW YELLOW   APPearance CLEAR CLEAR   Specific Gravity, Urine 1.011 1.005 - 1.030   pH 6.0 5.0 - 8.0   Glucose, UA NEGATIVE NEGATIVE mg/dL   Hgb urine dipstick NEGATIVE NEGATIVE   Bilirubin Urine NEGATIVE NEGATIVE   Ketones, ur NEGATIVE NEGATIVE mg/dL   Protein, ur NEGATIVE NEGATIVE mg/dL    Urobilinogen, UA 1.0 0.0 - 1.0 mg/dL   Nitrite NEGATIVE NEGATIVE   Leukocytes, UA NEGATIVE NEGATIVE    Comment: MICROSCOPIC NOT DONE ON URINES WITH NEGATIVE PROTEIN, BLOOD, LEUKOCYTES, NITRITE, OR GLUCOSE <1000 mg/dL.  Troponin I     Status: None   Collection Time: 02/05/15  8:13 PM  Result Value Ref Range   Troponin I <0.03 <0.031 ng/mL    Comment:        NO INDICATION OF MYOCARDIAL INJURY.   TSH     Status: None   Collection Time: 02/05/15  8:13 PM  Result Value Ref Range   TSH 3.357 0.350 - 4.500 uIU/mL  Troponin I     Status: None   Collection Time: 02/06/15  1:46 AM  Result Value Ref Range   Troponin I <0.03 <0.031 ng/mL    Comment:        NO INDICATION OF MYOCARDIAL INJURY.   Troponin I     Status: None   Collection Time: 02/06/15  7:36 AM  Result Value Ref Range   Troponin I <0.03 <0.031 ng/mL    Comment:        NO INDICATION OF MYOCARDIAL INJURY.     Dg Chest 2 View  02/05/2015   CLINICAL DATA:  Per ED note: Pt presents from church with c/o syncopal episode. Pt had witnessed full syncopal episode. Patient was extremely diaphoretic -- soaked through his shirt, and was cool to the touch on EMS arrival. Pt is A&Ox4, in NAD.  EXAM: CHEST  2 VIEW  COMPARISON:  12/03/2013  FINDINGS: Cardiac silhouette is normal in size and configuration. The aorta is mildly uncoiled. No mediastinal or hilar masses or evidence adenopathy.  Lungs show interstitial prominence, but no consolidation or convincing edema. Peribronchial cuffing noted on the prior study is less prominent currently.  No pleural effusion or pneumothorax.  Bony thorax is unremarkable.  IMPRESSION: No acute cardiopulmonary disease.   Electronically Signed   By: Lajean Manes M.D.   On: 02/05/2015 16:30    Review of Systems  Constitutional: Positive for diaphoresis. Negative for fever and chills.  Eyes: Negative for double vision and photophobia.  Respiratory: Negative for cough, hemoptysis and sputum production.    Cardiovascular: Negative for chest pain, palpitations, orthopnea and leg swelling.  Gastrointestinal: Negative for nausea and vomiting.  Genitourinary: Negative for dysuria.  Neurological: Positive for dizziness and weakness. Negative for headaches.   Blood pressure 152/80, pulse 68, temperature 98.1 F (36.7 C), temperature source Oral, resp. rate  18, height _0  (1.854 m), weight 83.462 kg (184 lb), SpO2 93 %. Physical Exam  Constitutional: He is oriented to person, place, and time.  HENT:  Head: Normocephalic and atraumatic.  Eyes: Conjunctivae are normal. Left eye exhibits no discharge. No scleral icterus.  Neck: Normal range of motion. Neck supple. No JVD present. No tracheal deviation present. No thyromegaly present.  Cardiovascular: Normal rate and regular rhythm.   Murmur (Soft systolic murmur noted no S3 gallop) heard. Respiratory: Effort normal and breath sounds normal. No respiratory distress. He has no wheezes. He has no rales.  GI: Soft. Bowel sounds are normal. He exhibits no distension. There is no tenderness. There is no rebound.  Musculoskeletal: He exhibits no edema or tenderness.  Neurological: He is alert and oriented to person, place, and time.    Assessment/Plan: Status post dizziness/near syncope rule out cardiac arrhythmias/rule out orthostatic hypotension Coronary artery disease history of PCI to LAD in 2007 stable status post recent nuclear stress test which is negative for ischemia. Hypertension Compensated systolic/diastolic congestive heart failure COPD Tobacco abuse History of CVA Hyperlipidemia Acute kidney injury Dehydration Plan Agree with holding Lasix and chlorthalidone. We will start ACE inhibitor once renal function improves Check 2-D echo Check orthostatics Hopefully discharge home tomorrow if stable  [name] 02/06/2015, 11:28 AM

## 2015-02-07 ENCOUNTER — Ambulatory Visit (HOSPITAL_BASED_OUTPATIENT_CLINIC_OR_DEPARTMENT_OTHER): Payer: Medicare Other

## 2015-02-07 DIAGNOSIS — R55 Syncope and collapse: Secondary | ICD-10-CM

## 2015-02-07 DIAGNOSIS — I5042 Chronic combined systolic (congestive) and diastolic (congestive) heart failure: Secondary | ICD-10-CM

## 2015-02-07 DIAGNOSIS — N179 Acute kidney failure, unspecified: Secondary | ICD-10-CM | POA: Diagnosis not present

## 2015-02-07 LAB — BASIC METABOLIC PANEL
ANION GAP: 6 (ref 5–15)
BUN: 19 mg/dL (ref 6–20)
CALCIUM: 9.3 mg/dL (ref 8.9–10.3)
CO2: 28 mmol/L (ref 22–32)
CREATININE: 1.2 mg/dL (ref 0.61–1.24)
Chloride: 106 mmol/L (ref 101–111)
GFR calc Af Amer: >60 mL/min (ref >60)
GFR calc non Af Amer: 60 mL/min — ABNORMAL LOW (ref >60)
GLUCOSE: 96 mg/dL (ref 65–99)
Potassium: 4.4 mmol/L (ref 3.5–5.1)
SODIUM: 140 mmol/L (ref 135–145)

## 2015-02-07 MED ORDER — CARVEDILOL 3.125 MG PO TABS: 3.1250 mg | ORAL_TABLET | Freq: Two times a day (BID) | ORAL | Status: DC

## 2015-02-07 MED ORDER — LISINOPRIL 20 MG PO TABS
20.0000 mg | ORAL_TABLET | Freq: Two times a day (BID) | ORAL | Status: DC
  Filled 2015-02-07 (×2): qty 1

## 2015-02-07 MED ORDER — FUROSEMIDE 40 MG PO TABS
40.0000 mg | ORAL_TABLET | Freq: Every day | ORAL | Status: DC
  Filled 2015-02-07: qty 1

## 2015-02-07 NOTE — Progress Notes (Signed)
Echocardiogram 2D Echocardiogram has been performed.  Randy Walter 02/07/2015, 11:49 AM

## 2015-02-07 NOTE — Discharge Summary (Signed)
Physician Discharge Summary  Randy Walter:[id number] DOB: 1945/08/07 DOA: 02/05/2015  PCP: [name], MD  Admit date: 02/05/2015 Discharge date: 02/07/2015  Time spent: 35 minutes  Recommendations for Outpatient Follow-up:  Follow up with Cardiology for ECHO results and further evaluation.   Discharge Diagnoses:    Near syncope.    Uncontrolled hypertension   CAD (coronary artery disease)   Chronic combined systolic and diastolic CHF (congestive heart failure)   AKI (acute kidney injury)   Discharge Condition: stable.   Diet recommendation: heart healthy  Filed Weights   02/05/15 1958 02/06/15 0531 02/07/15 0459  Weight: 82.691 kg (182 lb 4.8 oz) 83.462 kg (184 lb) 83.099 kg (183 lb 3.2 oz)    History of present illness:  69 year old male who  has a past medical history of Hypertension; Arthritis; CAD (coronary artery disease); and Tobacco use. today was brought to the hospital by EMS after patient had an episode of near-syncope while he wasn't feeling well at church. As per patient the chest was overcrowded and patient suddenly became very dizzy and started sweating. Though he did not pass out, he was feeling extremely weak and then sat on the chair. He denies chest pain or shortness of breath. Patient does have a history of CAD with coronary stent in place, patient also takes Lasix for congestive heart failure. Last echo from June 2015 shows both systolic and diastolic heart failure EF 20-25% him a diffuse hypokinesis. Patient says that he also had a similar episode while he was mowing his yard, he became extremely short of breath and sweaty. He denies nausea vomiting or diarrhea. No fever no dysuria urgency frequency of urination.  Hospital Course:  1-near syncope;  Might be related to orthostatics hypotension.  No arrhythmia on telemetry Enzymes negative.  ECHO ordered. cardiology will read ECHO and inform patient results, at follow up visit;  Cardiology consulted.   Hold diuretics.  NSL   2-Acute kidney injury Holding diuretics. And ACE. Repeat B-met.   Coronary artery disease Continue aspirin, Coreg, Lipitor  Hypertension Continue Coreg.  Resume lasix and lisinopril at discharge.   Procedures: ECHO, results pending.  Consultations:  none  Discharge Exam: Filed Vitals:   02/07/15 0456  BP:   Pulse:   Temp: 98.2 F (36.8 C)  Resp:     General: Alert in NAD Cardiovascular: S 1, S 2 RRR Respiratory: CTA  Discharge Instructions   Discharge Instructions    Diet - low sodium heart healthy    Complete by:  As directed      Increase activity slowly    Complete by:  As directed           Current Discharge Medication List    CONTINUE these medications which have CHANGED   Details  carvedilol (COREG) 3.125 MG tablet Take 1 tablet (3.125 mg total) by mouth 2 (two) times daily with a meal. Qty: 30 tablet, Refills: 0      CONTINUE these medications which have NOT CHANGED   Details  aspirin EC 81 MG tablet Take 1 tablet (81 mg total) by mouth daily.    atorvastatin (LIPITOR) 10 MG tablet Take 1 tablet (10 mg total) by mouth daily. Qty: 30 tablet, Refills: 1    furosemide (LASIX) 40 MG tablet Take 1 tablet (40 mg total) by mouth daily. Qty: 30 tablet, Refills: 1    lisinopril (PRINIVIL,ZESTRIL) 20 MG tablet Take 20 mg by mouth 2 (two) times daily.      STOP  taking these medications     Azilsartan-Chlorthalidone (EDARBYCLOR) 40-12.5 MG TABS      guaiFENesin (MUCINEX) 600 MG 12 hr tablet      loratadine (CLARITIN) 10 MG tablet        No Known Allergies Follow-up Information    Follow up with [name], MD.   Specialty:  Cardiology   Contact information:   [address] [place] [address] [phone]        The results of significant diagnostics from this hospitalization (including imaging, microbiology, ancillary and laboratory) are listed below for reference.    Significant  Diagnostic Studies: Dg Chest 2 View  02/05/2015   CLINICAL DATA:  Per ED note: Pt presents from church with c/o syncopal episode. Pt had witnessed full syncopal episode. Patient was extremely diaphoretic -- soaked through his shirt, and was cool to the touch on EMS arrival. Pt is A&Ox4, in NAD.  EXAM: CHEST  2 VIEW  COMPARISON:  12/03/2013  FINDINGS: Cardiac silhouette is normal in size and configuration. The aorta is mildly uncoiled. No mediastinal or hilar masses or evidence adenopathy.  Lungs show interstitial prominence, but no consolidation or convincing edema. Peribronchial cuffing noted on the prior study is less prominent currently.  No pleural effusion or pneumothorax.  Bony thorax is unremarkable.  IMPRESSION: No acute cardiopulmonary disease.   Electronically Signed   By: Lajean Manes M.D.   On: 02/05/2015 16:30    Microbiology: No results found for this or any previous visit (from the past 240 hour(s)).   Labs: Basic Metabolic Panel:  Recent Labs Lab 02/05/15 1555 02/06/15 0736 02/07/15 0414  NA 141 141 140  K 4.5 4.4 4.4  CL 106 107 106  CO2 _0 GLUCOSE 116* 118* 96  BUN 21* 15 19  CREATININE 1.59* 1.18 1.20  CALCIUM 9.0 9.1 9.3   Liver Function Tests: No results for input(s): AST, ALT, ALKPHOS, BILITOT, PROT, ALBUMIN in the last 168 hours. No results for input(s): LIPASE, AMYLASE in the last 168 hours. No results for input(s): AMMONIA in the last 168 hours. CBC:  Recent Labs Lab 02/05/15 1555  WBC 7.5  HGB 11.9*  HCT 36.7*  MCV 88.9  PLT 207   Cardiac Enzymes:  Recent Labs Lab 02/05/15 1555 02/05/15 2013 02/06/15 0146 02/06/15 0736  TROPONINI <0.03 <0.03 <0.03 <0.03   BNP: BNP (last 3 results)  Recent Labs  02/05/15 1555  BNP 15.4    ProBNP (last 3 results) No results for input(s): PROBNP in the last 8760 hours.  CBG:  Recent Labs Lab 02/05/15 1532  GLUCAP 86       Signed:  Mikael Skoda A  [facility] 02/07/2015, 1:32 PM

## 2015-02-07 NOTE — Evaluation (Signed)
Physical Therapy Evaluation and discharge Patient Details Name: Randy Walter MRN: [id number] DOB: 1946/04/03 Today's Date: 02/07/2015   History of Present Illness  69 y/o admitted after having near syncopal episode at church with dizziness and diaphoresis.  Clinical Impression  Pt ambulating hallway with S to I with no AD with obstacle negotiation and with head turns/nods, starts/stops, and quick turns with no difficulty or LOB. Pt reports no dizziness throughout session. No skilled PT needs identified and will d/c at this time.    Follow Up Recommendations No PT follow up    Equipment Recommendations  None recommended by PT    Recommendations for Other Services       Precautions / Restrictions Precautions Precautions: None      Mobility  Bed Mobility Overal bed mobility: Modified Independent                Transfers Overall transfer level: Modified independent                  Ambulation/Gait Ambulation/Gait assistance: Modified independent (Device/Increase time);Supervision Ambulation Distance (Feet): 480 Feet Assistive device: None Gait Pattern/deviations: Step-through pattern Gait velocity: WNL   General Gait Details: Performed gait with obstacles and aspects of dynamic gait index with no LOB.  Stairs            Wheelchair Mobility    Modified Rankin (Stroke Patients Only)       Balance Overall balance assessment: No apparent balance deficits (not formally assessed)                                           Pertinent Vitals/Pain Pain Assessment: Faces Faces Pain Scale: Hurts a little bit Pain Location: stiffness and sore from laying in bed Pain Intervention(s): Repositioned    Home Living Family/patient expects to be discharged to:: Private residence Living Arrangements: Spouse/significant other   Type of Home: House Home Access: Stairs to enter   Secretary/administrator of Steps: 1 Home Layout: Laundry or  work area in basement (doesn't go down the steps.  Goes outside to enter.) Home Equipment: Crutches      Prior Function Level of Independence: Independent               Hand Dominance   Dominant Hand: Right    Extremity/Trunk Assessment   Upper Extremity Assessment: Overall WFL for tasks assessed           Lower Extremity Assessment: Overall WFL for tasks assessed      Cervical / Trunk Assessment: Normal  Communication   Communication: No difficulties  Cognition Arousal/Alertness: Awake/alert Behavior During Therapy: WFL for tasks assessed/performed Overall Cognitive Status: Within Functional Limits for tasks assessed                      General Comments General comments (skin integrity, edema, etc.): Reviewed sitting LE therex with pt.  He stated he does do some exercises already.    Exercises        Assessment/Plan    PT Assessment Patent does not need any further PT services  PT Diagnosis Difficulty walking   PT Problem List    PT Treatment Interventions     PT Goals (Current goals can be found in the Care Plan section) Acute Rehab PT Goals Patient Stated Goal: go home PT Goal Formulation: All assessment and education complete,  DC therapy    Frequency     Barriers to discharge        Co-evaluation               End of Session Equipment Utilized During Treatment: Gait belt Activity Tolerance: Patient tolerated treatment well Patient left: in chair;with call bell/phone within reach Nurse Communication: Mobility status    Functional Assessment Tool Used: clinical judgement and objective findings. Functional Limitation: Mobility: Walking and moving around Mobility: Walking and Moving Around Current Status [phone]): 0 percent impaired, limited or restricted Mobility: Walking and Moving Around Goal Status [phone]): 0 percent impaired, limited or restricted Mobility: Walking and Moving Around Discharge Status [phone]): 0 percent  impaired, limited or restricted    Time: 5284-1324 PT Time Calculation (min) (ACUTE ONLY): 16 min   Charges:   PT Evaluation $Initial PT Evaluation Tier I: 1 Procedure     PT G Codes:   PT G-Codes **NOT FOR INPATIENT CLASS** Functional Assessment Tool Used: clinical judgement and objective findings. Functional Limitation: Mobility: Walking and moving around Mobility: Walking and Moving Around Current Status [phone]): 0 percent impaired, limited or restricted Mobility: Walking and Moving Around Goal Status [phone]): 0 percent impaired, limited or restricted Mobility: Walking and Moving Around Discharge Status [phone]): 0 percent impaired, limited or restricted    Bryanah Sidell LUBECK 02/07/2015, 10:09 AM

## 2015-02-07 NOTE — Progress Notes (Signed)
Subjective:  Doing well denies any chest pain or shortness of breath. No further episodes of dizziness or near syncopal episode. Renal function has improved. 2-D echo has not been done yet cardiac enzymes have been negative. No arrhythmias noted  Objective:  Vital Signs in the last 24 hours: Temp:  [98 F (36.7 C)-98.2 F (36.8 C)] 98.2 F (36.8 C) (08/09 0456) Pulse Rate:  [61-68] 61 (08/08 2205) Resp:  [18-19] 19 (08/08 2205) BP: (140-154)/(67-69) 154/67 mmHg (08/08 2205) SpO2:  [95 %-96 %] 96 % (08/08 2205) Weight:  [83.099 kg (183 lb 3.2 oz)] 83.099 kg (183 lb 3.2 oz) (08/09 0459)  Intake/Output from previous day: 08/08 0701 - 08/09 0700 In: 1182 [P.O.:1182] Out: 2575 [Urine:2575] Intake/Output from this shift: Total I/O In: 240 [P.O.:240] Out: 125 [Urine:125]  Physical Exam: Neck: no adenopathy, no carotid bruit, no JVD and supple, symmetrical, trachea midline Lungs: clear to auscultation bilaterally Heart: regular rate and rhythm, S1, S2 normal and Soft systolic murmur noted no S3 gallop Abdomen: soft, non-tender; bowel sounds normal; no masses,  no organomegaly Extremities: extremities normal, atraumatic, no cyanosis or edema  Lab Results:  Recent Labs  02/05/15 1555  WBC 7.5  HGB 11.9*  PLT 207    Recent Labs  02/06/15 0736 02/07/15 0414  NA 141 140  K 4.4 4.4  CL 107 106  CO2 29 28  GLUCOSE 118* 96  BUN 15 19  CREATININE 1.18 1.20    Recent Labs  02/06/15 0146 02/06/15 0736  TROPONINI <0.03 <0.03   Hepatic Function Panel No results for input(s): PROT, ALBUMIN, AST, ALT, ALKPHOS, BILITOT, BILIDIR, IBILI in the last 72 hours. No results for input(s): CHOL in the last 72 hours. No results for input(s): PROTIME in the last 72 hours.  Imaging: Imaging results have been reviewed and Dg Chest 2 View  02/05/2015   CLINICAL DATA:  Per ED note: Pt presents from church with c/o syncopal episode. Pt had witnessed full syncopal episode. Patient was  extremely diaphoretic -- soaked through his shirt, and was cool to the touch on EMS arrival. Pt is A&Ox4, in NAD.  EXAM: CHEST  2 VIEW  COMPARISON:  12/03/2013  FINDINGS: Cardiac silhouette is normal in size and configuration. The aorta is mildly uncoiled. No mediastinal or hilar masses or evidence adenopathy.  Lungs show interstitial prominence, but no consolidation or convincing edema. Peribronchial cuffing noted on the prior study is less prominent currently.  No pleural effusion or pneumothorax.  Bony thorax is unremarkable.  IMPRESSION: No acute cardiopulmonary disease.   Electronically Signed   By: Amie Portland M.D.   On: 02/05/2015 16:30    Cardiac Studies:  Assessment/Plan:  Status post dizziness/near syncope rule out cardiac arrhythmias/rule out orthostatic hypotension Coronary artery disease history of PCI to LAD in 2007 stable status post recent nuclear stress test which is negative for ischemia. Hypertension Compensated systolic/diastolic congestive heart failure COPD Tobacco abuse History of CVA Hyperlipidemia Status post Acute kidney injury Status post Dehydration   plan Start lisinopril 10 mg daily Okay to discharge from cardiac point of view. Will do 2-D echo  as outpatient if not done this admission. Follow-up with me in one week  Rinaldo Cloud 02/07/2015, 8:58 AM

## 2016-10-17 ENCOUNTER — Encounter (HOSPITAL_COMMUNITY): Payer: Self-pay

## 2016-10-17 ENCOUNTER — Emergency Department (HOSPITAL_COMMUNITY): Payer: Medicare Other

## 2016-10-17 ENCOUNTER — Inpatient Hospital Stay (HOSPITAL_COMMUNITY)
Admission: EM | Admit: 2016-10-17 | Discharge: 2016-10-18 | DRG: 287 | Disposition: A | Discharge status: Home / Self Care | Payer: Medicare Other | Attending: Cardiology | Admitting: Cardiology

## 2016-10-17 DIAGNOSIS — T82855A Stenosis of coronary artery stent, initial encounter: Secondary | ICD-10-CM | POA: Diagnosis present

## 2016-10-17 DIAGNOSIS — N182 Chronic kidney disease, stage 2 (mild): Secondary | ICD-10-CM | POA: Diagnosis present

## 2016-10-17 DIAGNOSIS — I5042 Chronic combined systolic (congestive) and diastolic (congestive) heart failure: Secondary | ICD-10-CM | POA: Diagnosis present

## 2016-10-17 DIAGNOSIS — Z7982 Long term (current) use of aspirin: Secondary | ICD-10-CM | POA: Diagnosis not present

## 2016-10-17 DIAGNOSIS — Z8673 Personal history of transient ischemic attack (TIA), and cerebral infarction without residual deficits: Secondary | ICD-10-CM | POA: Diagnosis not present

## 2016-10-17 DIAGNOSIS — R05 Cough: Secondary | ICD-10-CM | POA: Diagnosis present

## 2016-10-17 DIAGNOSIS — M1990 Unspecified osteoarthritis, unspecified site: Secondary | ICD-10-CM | POA: Diagnosis present

## 2016-10-17 DIAGNOSIS — I130 Hypertensive heart and chronic kidney disease with heart failure and stage 1 through stage 4 chronic kidney disease, or unspecified chronic kidney disease: Secondary | ICD-10-CM | POA: Diagnosis present

## 2016-10-17 DIAGNOSIS — E785 Hyperlipidemia, unspecified: Secondary | ICD-10-CM | POA: Diagnosis present

## 2016-10-17 DIAGNOSIS — I249 Acute ischemic heart disease, unspecified: Secondary | ICD-10-CM | POA: Diagnosis present

## 2016-10-17 DIAGNOSIS — I25110 Atherosclerotic heart disease of native coronary artery with unstable angina pectoris: Secondary | ICD-10-CM | POA: Diagnosis present

## 2016-10-17 DIAGNOSIS — Z8249 Family history of ischemic heart disease and other diseases of the circulatory system: Secondary | ICD-10-CM

## 2016-10-17 DIAGNOSIS — F17210 Nicotine dependence, cigarettes, uncomplicated: Secondary | ICD-10-CM | POA: Diagnosis present

## 2016-10-17 DIAGNOSIS — I700 Atherosclerosis of aorta: Secondary | ICD-10-CM | POA: Diagnosis present

## 2016-10-17 DIAGNOSIS — Z955 Presence of coronary angioplasty implant and graft: Secondary | ICD-10-CM

## 2016-10-17 DIAGNOSIS — Z79899 Other long term (current) drug therapy: Secondary | ICD-10-CM | POA: Diagnosis not present

## 2016-10-17 DIAGNOSIS — X58XXXA Exposure to other specified factors, initial encounter: Secondary | ICD-10-CM | POA: Diagnosis present

## 2016-10-17 DIAGNOSIS — J449 Chronic obstructive pulmonary disease, unspecified: Secondary | ICD-10-CM | POA: Diagnosis present

## 2016-10-17 DIAGNOSIS — D631 Anemia in chronic kidney disease: Secondary | ICD-10-CM | POA: Diagnosis present

## 2016-10-17 DIAGNOSIS — E876 Hypokalemia: Secondary | ICD-10-CM | POA: Diagnosis present

## 2016-10-17 DIAGNOSIS — R079 Chest pain, unspecified: Secondary | ICD-10-CM

## 2016-10-17 DIAGNOSIS — R072 Precordial pain: Secondary | ICD-10-CM | POA: Diagnosis present

## 2016-10-17 HISTORY — DX: Cerebral infarction, unspecified: I63.9

## 2016-10-17 HISTORY — DX: Pure hypercholesterolemia, unspecified: E78.00

## 2016-10-17 HISTORY — DX: Chronic obstructive pulmonary disease, unspecified: J44.9

## 2016-10-17 HISTORY — DX: Heart failure, unspecified: I50.9

## 2016-10-17 LAB — BASIC METABOLIC PANEL
Anion gap: 6 (ref 5–15)
BUN: 17 mg/dL (ref 6–20)
CALCIUM: 9.2 mg/dL (ref 8.9–10.3)
CHLORIDE: 107 mmol/L (ref 101–111)
CO2: 28 mmol/L (ref 22–32)
CREATININE: 1.42 mg/dL — AB (ref 0.61–1.24)
GFR, EST AFRICAN AMERICAN: 56 mL/min — AB (ref >60)
GFR, EST NON AFRICAN AMERICAN: 48 mL/min — AB (ref >60)
Glucose, Bld: 91 mg/dL (ref 65–99)
Potassium: 4.5 mmol/L (ref 3.5–5.1)
SODIUM: 141 mmol/L (ref 135–145)

## 2016-10-17 LAB — CBC WITH DIFFERENTIAL/PLATELET
Basophils Absolute: 0 10*3/uL (ref 0.0–0.1)
Basophils Relative: 0 %
EOS ABS: 0.2 10*3/uL (ref 0.0–0.7)
Eosinophils Relative: 3 %
HCT: 33 % — ABNORMAL LOW (ref 39.0–52.0)
HEMOGLOBIN: 10.1 g/dL — AB (ref 13.0–17.0)
LYMPHS ABS: 2.6 10*3/uL (ref 0.7–4.0)
LYMPHS PCT: 54 %
MCH: 27.5 pg (ref 26.0–34.0)
MCHC: 30.6 g/dL (ref 30.0–36.0)
MCV: 89.9 fL (ref 78.0–100.0)
Monocytes Absolute: 0.3 10*3/uL (ref 0.1–1.0)
Monocytes Relative: 6 %
NEUTROS PCT: 37 %
Neutro Abs: 1.8 10*3/uL (ref 1.7–7.7)
Platelets: 230 10*3/uL (ref 150–400)
RBC: 3.67 MIL/uL — AB (ref 4.22–5.81)
RDW: 14.2 % (ref 11.5–15.5)
WBC: 4.8 10*3/uL (ref 4.0–10.5)

## 2016-10-17 LAB — CBC
HCT: 34.4 % — ABNORMAL LOW (ref 39.0–52.0)
Hemoglobin: 10.7 g/dL — ABNORMAL LOW (ref 13.0–17.0)
MCH: 27.6 pg (ref 26.0–34.0)
MCHC: 31.1 g/dL (ref 30.0–36.0)
MCV: 88.7 fL (ref 78.0–100.0)
Platelets: 244 10*3/uL (ref 150–400)
RBC: 3.88 MIL/uL — AB (ref 4.22–5.81)
RDW: 14.2 % (ref 11.5–15.5)
WBC: 4.4 10*3/uL (ref 4.0–10.5)

## 2016-10-17 LAB — COMPREHENSIVE METABOLIC PANEL
ALT: 11 U/L — ABNORMAL LOW (ref 17–63)
ANION GAP: 8 (ref 5–15)
AST: 17 U/L (ref 15–41)
Albumin: 3.3 g/dL — ABNORMAL LOW (ref 3.5–5.0)
Alkaline Phosphatase: 60 U/L (ref 38–126)
BUN: 18 mg/dL (ref 6–20)
CHLORIDE: 107 mmol/L (ref 101–111)
CO2: 29 mmol/L (ref 22–32)
Calcium: 9.1 mg/dL (ref 8.9–10.3)
Creatinine, Ser: 1.36 mg/dL — ABNORMAL HIGH (ref 0.61–1.24)
GFR, EST AFRICAN AMERICAN: 59 mL/min — AB (ref >60)
GFR, EST NON AFRICAN AMERICAN: 51 mL/min — AB (ref >60)
Glucose, Bld: 165 mg/dL — ABNORMAL HIGH (ref 65–99)
POTASSIUM: 4.3 mmol/L (ref 3.5–5.1)
Sodium: 144 mmol/L (ref 135–145)
Total Bilirubin: 0.5 mg/dL (ref 0.3–1.2)
Total Protein: 5.9 g/dL — ABNORMAL LOW (ref 6.5–8.1)

## 2016-10-17 LAB — TROPONIN I: Troponin I: <0.03 ng/mL (ref <0.03)

## 2016-10-17 LAB — PROTIME-INR
INR: 1.09
PROTHROMBIN TIME: 14.2 s (ref 11.4–15.2)

## 2016-10-17 LAB — I-STAT TROPONIN, ED: Troponin i, poc: 0 ng/mL (ref 0.00–0.08)

## 2016-10-17 MED ORDER — ATORVASTATIN CALCIUM 80 MG PO TABS
80.0000 mg | ORAL_TABLET | Freq: Every day | ORAL | Status: DC
  Administered 2016-10-18: 80 mg via ORAL
  Filled 2016-10-17: qty 1

## 2016-10-17 MED ORDER — HEPARIN (PORCINE) IN NACL 100-0.45 UNIT/ML-% IJ SOLN
750.0000 [IU]/h | INTRAMUSCULAR | Status: DC
  Administered 2016-10-17: 1000 [IU]/h via INTRAVENOUS
  Filled 2016-10-17: qty 250

## 2016-10-17 MED ORDER — SODIUM CHLORIDE 0.9% FLUSH: 3.0000 mL | INTRAVENOUS | Status: DC | PRN

## 2016-10-17 MED ORDER — ASPIRIN EC 81 MG PO TBEC
81.0000 mg | DELAYED_RELEASE_TABLET | Freq: Every day | ORAL | Status: DC
  Filled 2016-10-17: qty 1

## 2016-10-17 MED ORDER — ONDANSETRON HCL 4 MG/2ML IJ SOLN: 4.0000 mg | Freq: Four times a day (QID) | INTRAMUSCULAR | Status: DC | PRN

## 2016-10-17 MED ORDER — SODIUM CHLORIDE 0.9 % IV SOLN: INTRAVENOUS | Status: DC

## 2016-10-17 MED ORDER — ASPIRIN 300 MG RE SUPP: 300.0000 mg | RECTAL | Status: DC

## 2016-10-17 MED ORDER — NITROGLYCERIN 0.4 MG SL SUBL: 0.4000 mg | SUBLINGUAL_TABLET | SUBLINGUAL | Status: DC | PRN

## 2016-10-17 MED ORDER — NITROGLYCERIN IN D5W 200-5 MCG/ML-% IV SOLN
5.0000 ug/min | INTRAVENOUS | Status: DC
  Administered 2016-10-17: 5 ug/min via INTRAVENOUS

## 2016-10-17 MED ORDER — SODIUM CHLORIDE 0.9 % WEIGHT BASED INFUSION
1.0000 mL/kg/h | INTRAVENOUS | Status: DC
  Administered 2016-10-17 – 2016-10-18 (×2): 1 mL/kg/h via INTRAVENOUS

## 2016-10-17 MED ORDER — ASPIRIN 81 MG PO CHEW
81.0000 mg | CHEWABLE_TABLET | ORAL | Status: AC
  Administered 2016-10-18: 81 mg via ORAL
  Filled 2016-10-17: qty 1

## 2016-10-17 MED ORDER — SODIUM CHLORIDE 0.9 % IV SOLN
250.0000 mL | INTRAVENOUS | Status: DC | PRN
Start: 2016-10-17 — End: 2016-10-18

## 2016-10-17 MED ORDER — ASPIRIN 81 MG PO CHEW: 324.0000 mg | CHEWABLE_TABLET | ORAL | Status: DC

## 2016-10-17 MED ORDER — ACETAMINOPHEN 325 MG PO TABS: 650.0000 mg | ORAL_TABLET | ORAL | Status: DC | PRN

## 2016-10-17 MED ORDER — PANTOPRAZOLE SODIUM 40 MG PO TBEC: 40.0000 mg | DELAYED_RELEASE_TABLET | Freq: Every day | ORAL | Status: DC

## 2016-10-17 MED ORDER — AMLODIPINE BESYLATE 5 MG PO TABS
5.0000 mg | ORAL_TABLET | Freq: Every evening | ORAL | Status: DC
  Administered 2016-10-17 – 2016-10-18 (×2): 5 mg via ORAL
  Filled 2016-10-17 (×2): qty 1

## 2016-10-17 MED ORDER — CARVEDILOL 3.125 MG PO TABS
3.1250 mg | ORAL_TABLET | Freq: Two times a day (BID) | ORAL | Status: DC
  Administered 2016-10-18 (×2): 3.125 mg via ORAL
  Filled 2016-10-17 (×3): qty 1

## 2016-10-17 MED ORDER — HEPARIN BOLUS VIA INFUSION
4000.0000 [IU] | Freq: Once | INTRAVENOUS | Status: AC
  Administered 2016-10-17: 4000 [IU] via INTRAVENOUS
  Filled 2016-10-17: qty 4000

## 2016-10-17 MED ORDER — SODIUM CHLORIDE 0.9% FLUSH
3.0000 mL | Freq: Two times a day (BID) | INTRAVENOUS | Status: DC
  Administered 2016-10-17: 3 mL via INTRAVENOUS

## 2016-10-17 MED ORDER — CLOPIDOGREL BISULFATE 75 MG PO TABS
300.0000 mg | ORAL_TABLET | Freq: Once | ORAL | Status: AC
  Administered 2016-10-17: 300 mg via ORAL
  Filled 2016-10-17: qty 4

## 2016-10-17 MED ORDER — NITROGLYCERIN 2 % TD OINT
1.0000 [in_us] | TOPICAL_OINTMENT | Freq: Once | TRANSDERMAL | Status: AC
  Administered 2016-10-17: 1 [in_us] via TOPICAL
  Filled 2016-10-17: qty 1

## 2016-10-17 MED ORDER — NITROGLYCERIN IN D5W 200-5 MCG/ML-% IV SOLN
0.0000 ug/min | Freq: Once | INTRAVENOUS | Status: AC
  Administered 2016-10-17: 5 ug/min via INTRAVENOUS
  Filled 2016-10-17: qty 250

## 2016-10-17 NOTE — ED Triage Notes (Signed)
Pt arrives via [facility] from home with c/o left chest pressure that radiated down left arm. 324 mg asa and 1 sl nitro administered. Ems reports pts pain had improved some upon their arrival. Hx of cardiac stents, a/ox 4, VSS: 132/81, 72hr, 16r, 95% ra, nad.

## 2016-10-17 NOTE — ED Notes (Signed)
Patient transported to X-ray 

## 2016-10-17 NOTE — Progress Notes (Signed)
ANTICOAGULATION CONSULT NOTE - Initial Consult  Pharmacy Consult for heparin Indication: chest pain/ACS  No Known Allergies  Patient Measurements: Weight: 180 lb (81.6 kg) Heparin Dosing Weight: 80 kg  Vital Signs: Temp: 97.9 F (36.6 C) (04/19 1539) Temp Source: Oral (04/19 1539) BP: 148/82 (04/19 1715) Pulse Rate: 63 (04/19 1715)  Labs:  Recent Labs  10/17/16 1540  HGB 10.7*  HCT 34.4*  PLT 244  CREATININE 1.42*    CrCl cannot be calculated (Unknown ideal weight.).   Medical History: Past Medical History:  Diagnosis Date  . Arthritis   . CAD (coronary artery disease)   . Hypertension   . Tobacco use    Assessment: 71 yo with history CAD presents with chest pain. Pharmacy consulted for heparin for r/o ACS. Not on AC PTA. hgb 10.7, PLT 244.  Goal of Therapy:  Heparin level 0.3-0.7 units/ml Monitor platelets by anticoagulation protocol: Yes   Plan:  Give 4000 units bolus x 1 Start heparin infusion at 1000 units/hr Check anti-Xa level in 8 hours and daily while on heparin Continue to monitor H&H and platelets  Sherron Monday 10/17/2016,5:47 PM

## 2016-10-17 NOTE — ED Notes (Signed)
Pt. returned from XR. 

## 2016-10-17 NOTE — H&P (Signed)
Randy Walter is an 71 y.o. male.   Chief Complaint: Chest pain radiating to left arm HPI: Patient is 71 year old male with past medical history significant for coronary artery disease history of PTCA stenting to mid LAD in 2007, hypertension, history of congestive heart failure secondary to systolic/diastolic dysfunction, hypokalemia lipidemia, tobacco abuse, COPD, history of CVA, came to ER complaining of retrosternal chest pain described as pressure grade 5-6/10 radiating to left arm associated with mild shortness of breath. Patient denies any nausea vomiting or diaphoresis. Denies PND orthopnea leg swelling. Denies palpitation lightheadedness or syncope. Patient also gives history of exertional dyspnea and claudication pain in the legs. Denies any recent cardiac workup.  Past Medical History:  Diagnosis Date  . Arthritis   . CAD (coronary artery disease)   . Hypertension   . Tobacco use     Past Surgical History:  Procedure Laterality Date  . CORONARY STENT PLACEMENT      Family History  Problem Relation Age of Onset  . CAD Maternal Grandmother    Social History:  reports that he has been smoking Cigarettes.  He has a 30.00 pack-year smoking history. He has never used smokeless tobacco. He reports that he drinks alcohol. He reports that he does not use drugs.  Allergies: No Known Allergies   (Not in a hospital admission)  Results for orders placed or performed during the hospital encounter of 10/17/16 (from the past 48 hour(s))  Basic metabolic panel     Status: Abnormal   Collection Time: 10/17/16  3:40 PM  Result Value Ref Range   Sodium 141 135 - 145 mmol/L   Potassium 4.5 3.5 - 5.1 mmol/L   Chloride 107 101 - 111 mmol/L   CO2 28 22 - 32 mmol/L   Glucose, Bld 91 65 - 99 mg/dL   BUN 17 6 - 20 mg/dL   Creatinine, Ser 1.42 (H) 0.61 - 1.24 mg/dL   Calcium 9.2 8.9 - 10.3 mg/dL   GFR calc non Af Amer 48 (L) >60 mL/min   GFR calc Af Amer 56 (L) >60 mL/min    Comment:  (NOTE) The eGFR has been calculated using the CKD EPI equation. This calculation has not been validated in all clinical situations. eGFR's persistently <60 mL/min signify possible Chronic Kidney Disease.    Anion gap 6 5 - 15  CBC     Status: Abnormal   Collection Time: 10/17/16  3:40 PM  Result Value Ref Range   WBC 4.4 4.0 - 10.5 K/uL   RBC 3.88 (L) 4.22 - 5.81 MIL/uL   Hemoglobin 10.7 (L) 13.0 - 17.0 g/dL   HCT 34.4 (L) 39.0 - 52.0 %   MCV 88.7 78.0 - 100.0 fL   MCH 27.6 26.0 - 34.0 pg   MCHC 31.1 30.0 - 36.0 g/dL   RDW 14.2 11.5 - 15.5 %   Platelets 244 150 - 400 K/uL  I-stat troponin, ED     Status: None   Collection Time: 10/17/16  3:59 PM  Result Value Ref Range   Troponin i, poc 0.00 0.00 - 0.08 ng/mL   Comment 3            Comment: Due to the release kinetics of cTnI, a negative result within the first hours of the onset of symptoms does not rule out myocardial infarction with certainty. If myocardial infarction is still suspected, repeat the test at appropriate intervals.    Dg Chest 2 View  Result Date: 10/17/2016 CLINICAL DATA:  Onset of left-sided chest pain. No shortness of breath. History of coronary artery disease and COPD. Patient is a current smoker. EXAM: CHEST  2 VIEW COMPARISON:  Chest x-ray of February 05, 2015 FINDINGS: The lungs are mildly hyperinflated. There is no focal infiltrate. There is no pleural effusion or pneumothorax. The heart and pulmonary vascularity are normal. The mediastinum is normal in width. There is calcification in the wall of the ascending aorta. The bony thorax exhibits no acute abnormality. IMPRESSION: COPD. There is no pneumonia, CHF, nor other acute cardiopulmonary abnormality. Thoracic aortic atherosclerosis. Electronically Signed   By: David  Martinique M.D.   On: 10/17/2016 16:55    Review of Systems  Constitutional: Negative for chills, diaphoresis and fever.  Eyes: Negative for double vision.  Respiratory: Positive for shortness  of breath. Negative for cough.   Cardiovascular: Positive for chest pain. Negative for palpitations, orthopnea, claudication and leg swelling.  Gastrointestinal: Negative for abdominal pain, melena and nausea.  Genitourinary: Negative for dysuria and hematuria.  Skin: Negative for rash.  Neurological: Negative for dizziness.    Blood pressure (!) 144/85, pulse 63, temperature 97.9 F (36.6 C), temperature source Oral, resp. rate (!) 23, weight 180 lb (81.6 kg), SpO2 100 %. Physical Exam  Constitutional: He is oriented to person, place, and time.  HENT:  Head: Normocephalic and atraumatic.  Eyes: Conjunctivae are normal. Pupils are equal, round, and reactive to light. Left eye exhibits no discharge.  Neck: Normal range of motion. Neck supple. No JVD present. No tracheal deviation present. No thyromegaly present.  Cardiovascular: Normal rate and regular rhythm.  Exam reveals no gallop and no friction rub.   Murmur (Soft systolic murmur noted) heard. Respiratory: Effort normal and breath sounds normal. No respiratory distress. He has no wheezes. He has no rales.  GI: Soft. Bowel sounds are normal. He exhibits no distension. There is no tenderness. There is no rebound and no guarding.  Musculoskeletal: He exhibits no edema, tenderness or deformity.  Neurological: He is alert and oriented to person, place, and time.     Assessment/Plan Acute coronary syndrome Coronary artery disease status post PCI to LAD in the remote past Hypertension Hyperlipidemia Compensated congestive heart failure secondary to systolic/diastolic dysfunction COPD Tobacco abuse History of CVA Chronic kidney disease stage II Anemia of chronic disease  Plan As per orders Discussed with patient and family at length various options of treatment i.e. noninvasive stress testing versus left cardiac catheterization possible PTCA stenting its risk and benefits i.e. death MI stroke need for emergency CABG local vascular  complications etc. and consents for PCI  [name], MD 10/17/2016, 6:42 PM

## 2016-10-17 NOTE — ED Provider Notes (Signed)
[facility]-EMERGENCY DEPT Provider Note   CSN: [id number] Arrival date & time: 10/17/16  1522     History   Chief Complaint Chief Complaint  Patient presents with  . Chest Pain    HPI Randy Walter is a 71 y.o. male.  The history is provided by the patient.  Chest Pain   This is a new problem. The current episode started 1 to 2 hours ago. The problem occurs constantly. The problem has been gradually improving. The pain is present in the substernal region. The pain is moderate. The quality of the pain is described as pressure-like and exertional. The pain radiates to the left arm. The symptoms are aggravated by certain positions. Associated symptoms include cough (dry) and shortness of breath. Pertinent negatives include no diaphoresis, no fever, no nausea and no vomiting. Risk factors include being elderly, male gender and smoking/tobacco exposure.  His past medical history is significant for CAD, hyperlipidemia, hypertension and strokes.  Pertinent negatives for past medical history include no CHF, no diabetes and no MI.  Procedure history is positive for cardiac catheterization (s/p stent 3 rys ago).    Past Medical History:  Diagnosis Date  . Arthritis   . CAD (coronary artery disease)   . Hypertension   . Tobacco use     Patient Active Problem List   Diagnosis Date Noted  . Near syncope 02/05/2015  . AKI (acute kidney injury) (HCC) 02/05/2015  . Chronic combined systolic and diastolic CHF (congestive heart failure) (HCC) 12/05/2013  . COPD (chronic obstructive pulmonary disease) (HCC) 12/03/2013  . SOB (shortness of breath) 12/03/2013  . Acute bronchitis 12/03/2013  . Uncontrolled hypertension 12/03/2013  . CAD (coronary artery disease) 12/03/2013    Past Surgical History:  Procedure Laterality Date  . CORONARY STENT PLACEMENT         Home Medications    Prior to Admission medications   Medication Sig Start Date End Date Taking? Authorizing Provider    amLODipine (NORVASC) 5 MG tablet Take 5 mg by mouth every evening.   Yes Historical Provider, MD  aspirin EC 81 MG tablet Take 1 tablet (81 mg total) by mouth daily. 12/05/13  Yes Estela Isaiah Blakes, MD  carvedilol (COREG) 3.125 MG tablet Take 1 tablet (3.125 mg total) by mouth 2 (two) times daily with a meal. 02/07/15  Yes Belkys A Regalado, MD  furosemide (LASIX) 40 MG tablet Take 1 tablet (40 mg total) by mouth daily. 12/05/13  Yes Henderson Cloud, MD  lisinopril (PRINIVIL,ZESTRIL) 20 MG tablet Take 20 mg by mouth 2 (two) times daily.   Yes Historical Provider, MD  atorvastatin (LIPITOR) 10 MG tablet Take 1 tablet (10 mg total) by mouth daily. Patient not taking: Reported on 10/17/2016 12/05/13   Henderson Cloud, MD    Family History Family History  Problem Relation Age of Onset  . CAD Maternal Grandmother     Social History Social History  Substance Use Topics  . Smoking status: Current Every Day Smoker    Packs/day: 0.50    Years: 60.00    Types: Cigarettes  . Smokeless tobacco: Never Used  . Alcohol use Yes     Comment: 12/03/13 - "none in 2 weeks"     Allergies   Patient has no known allergies.   Review of Systems Review of Systems  Constitutional: Negative for diaphoresis and fever.  Respiratory: Positive for cough (dry) and shortness of breath.   Cardiovascular: Positive for chest pain.  Gastrointestinal: Negative for nausea and vomiting.  All other systems are reviewed and are negative for acute change except as noted in the HPI    Physical Exam Updated Vital Signs BP (!) 144/84 (BP Location: Right Arm)   Pulse 63   Temp 97.9 F (36.6 C) (Oral)   Resp (!) 24   SpO2 95%   Physical Exam  Constitutional: He is oriented to person, place, and time. He appears well-developed and well-nourished. No distress.  HENT:  Head: Normocephalic and atraumatic.  Nose: Nose normal.  Eyes: Conjunctivae and EOM are normal. Pupils are equal, round, and  reactive to light. Right eye exhibits no discharge. Left eye exhibits no discharge. No scleral icterus.  Neck: Normal range of motion. Neck supple.  Cardiovascular: Normal rate and regular rhythm.  Exam reveals no gallop and no friction rub.   No murmur heard. Pulmonary/Chest: Effort normal and breath sounds normal. No stridor. No respiratory distress. He has no rales.  Abdominal: Soft. He exhibits no distension. There is no tenderness.  Musculoskeletal: He exhibits no edema or tenderness.  Neurological: He is alert and oriented to person, place, and time.  Skin: Skin is warm and dry. No rash noted. He is not diaphoretic. No erythema.  Psychiatric: He has a normal mood and affect.  Vitals reviewed.    ED Treatments / Results  Labs (all labs ordered are listed, but only abnormal results are displayed) Labs Reviewed  BASIC METABOLIC PANEL - Abnormal; Notable for the following:       Result Value   Creatinine, Ser 1.42 (*)    GFR calc non Af Amer 48 (*)    GFR calc Af Amer 56 (*)    All other components within normal limits  CBC - Abnormal; Notable for the following:    RBC 3.88 (*)    Hemoglobin 10.7 (*)    HCT 34.4 (*)    All other components within normal limits  I-STAT TROPOININ, ED    EKG  EKG Interpretation  Date/Time:  Thursday October 17 2016 15:38:57 EDT Ventricular Rate:  63 PR Interval:    QRS Duration: 86 QT Interval:  393 QTC Calculation: 403 R Axis:   12 Text Interpretation:  Sinus rhythm Prolonged PR interval Nonspecific T wave abnormality Otherwise no significant change Confirmed by [facility] MD, Twania Bujak ([id number]) on 10/17/2016 4:08:03 PM       Radiology Dg Chest 2 View  Result Date: 10/17/2016 CLINICAL DATA:  Onset of left-sided chest pain. No shortness of breath. History of coronary artery disease and COPD. Patient is a current smoker. EXAM: CHEST  2 VIEW COMPARISON:  Chest x-ray of February 05, 2015 FINDINGS: The lungs are mildly hyperinflated. There is no focal  infiltrate. There is no pleural effusion or pneumothorax. The heart and pulmonary vascularity are normal. The mediastinum is normal in width. There is calcification in the wall of the ascending aorta. The bony thorax exhibits no acute abnormality. IMPRESSION: COPD. There is no pneumonia, CHF, nor other acute cardiopulmonary abnormality. Thoracic aortic atherosclerosis. Electronically Signed   By: David  [place] M.D.   On: 10/17/2016 16:55    Procedures Procedures (including critical care time)  Medications Ordered in ED Medications  nitroGLYCERIN 50 mg in dextrose 5 % 250 mL (0.2 mg/mL) infusion (not administered)  nitroGLYCERIN (NITROGLYN) 2 % ointment 1 inch (1 inch Topical Given 10/17/16 1632)     Initial Impression / Assessment and Plan / ED Course  I have reviewed the triage vital signs and  the nursing notes.  Pertinent labs & imaging results that were available during my care of the patient were reviewed by me and considered in my medical decision making (see chart for details).     Typical chest pain concerning for ACS. EKG without acute ischemic changes. Initial troponin negative. Patient is high risk with known CAD and prior stenting. Will discuss case with cardiology for likely admission and ACS rule out. Patient already received aspirin by EMS. Pain improved after nitroglycerin.  Dr. Sharyn Lull requested Hep and NTG gtt. Started in the ED.  Low pretest probability for pulmonary embolism. Presentation not classic for aortic dissection or esophageal perforation.  Chest x-ray without evidence suggestive of pneumonia, pneumothorax, pneumomediastinum.  No abnormal contour of the mediastinum to suggest dissection. No evidence of acute injuries.   Final Clinical Impressions(s) / ED Diagnoses   Final diagnoses:  Chest pain      Nira Conn, MD 10/17/16 1730

## 2016-10-18 ENCOUNTER — Encounter (HOSPITAL_COMMUNITY)
Admission: EM | Disposition: A | Discharge status: Home / Self Care | Payer: Self-pay | Source: Home / Self Care | Attending: Cardiology

## 2016-10-18 ENCOUNTER — Encounter (HOSPITAL_COMMUNITY): Payer: Self-pay | Admitting: Cardiology

## 2016-10-18 HISTORY — PX: LEFT HEART CATH AND CORONARY ANGIOGRAPHY: CATH118249

## 2016-10-18 LAB — CBC
HCT: 32.9 % — ABNORMAL LOW (ref 39.0–52.0)
Hemoglobin: 10.2 g/dL — ABNORMAL LOW (ref 13.0–17.0)
MCH: 27.6 pg (ref 26.0–34.0)
MCHC: 31 g/dL (ref 30.0–36.0)
MCV: 88.9 fL (ref 78.0–100.0)
Platelets: 215 10*3/uL (ref 150–400)
RBC: 3.7 MIL/uL — ABNORMAL LOW (ref 4.22–5.81)
RDW: 14.2 % (ref 11.5–15.5)
WBC: 4.9 10*3/uL (ref 4.0–10.5)

## 2016-10-18 LAB — BASIC METABOLIC PANEL
Anion gap: 9 (ref 5–15)
BUN: 17 mg/dL (ref 6–20)
CHLORIDE: 108 mmol/L (ref 101–111)
CO2: 24 mmol/L (ref 22–32)
Calcium: 8.8 mg/dL — ABNORMAL LOW (ref 8.9–10.3)
Creatinine, Ser: 1.12 mg/dL (ref 0.61–1.24)
GFR calc Af Amer: >60 mL/min (ref >60)
GFR calc non Af Amer: >60 mL/min (ref >60)
GLUCOSE: 85 mg/dL (ref 65–99)
POTASSIUM: 3.9 mmol/L (ref 3.5–5.1)
Sodium: 141 mmol/L (ref 135–145)

## 2016-10-18 LAB — LIPID PANEL
CHOL/HDL RATIO: 3.6 ratio
Cholesterol: 151 mg/dL (ref 0–200)
HDL: 42 mg/dL (ref >40)
LDL CALC: 97 mg/dL (ref 0–99)
Triglycerides: 61 mg/dL (ref <150)
VLDL: 12 mg/dL (ref 0–40)

## 2016-10-18 LAB — TROPONIN I
Troponin I: <0.03 ng/mL (ref <0.03)
Troponin I: <0.03 ng/mL (ref <0.03)

## 2016-10-18 LAB — HEPARIN LEVEL (UNFRACTIONATED)
HEPARIN UNFRACTIONATED: 0.73 [IU]/mL — AB (ref 0.30–0.70)
HEPARIN UNFRACTIONATED: 0.77 [IU]/mL — AB (ref 0.30–0.70)

## 2016-10-18 LAB — GLUCOSE, CAPILLARY: GLUCOSE-CAPILLARY: 134 mg/dL — AB (ref 65–99)

## 2016-10-18 LAB — POCT ACTIVATED CLOTTING TIME: ACTIVATED CLOTTING TIME: 186 s

## 2016-10-18 SURGERY — LEFT HEART CATH AND CORONARY ANGIOGRAPHY
Anesthesia: LOCAL

## 2016-10-18 MED ORDER — FERROUS SULFATE 325 (65 FE) MG PO TABS
325.0000 mg | ORAL_TABLET | Freq: Three times a day (TID) | ORAL | Status: DC
  Administered 2016-10-18: 325 mg via ORAL
  Filled 2016-10-18: qty 1

## 2016-10-18 MED ORDER — SODIUM CHLORIDE 0.9% FLUSH: 3.0000 mL | INTRAVENOUS | Status: DC | PRN

## 2016-10-18 MED ORDER — HEPARIN (PORCINE) IN NACL 2-0.9 UNIT/ML-% IJ SOLN
INTRAMUSCULAR | Status: DC | PRN
  Administered 2016-10-18: 1000 mL

## 2016-10-18 MED ORDER — HYDRALAZINE HCL 20 MG/ML IJ SOLN
INTRAMUSCULAR | Status: AC
  Filled 2016-10-18: qty 1

## 2016-10-18 MED ORDER — SODIUM CHLORIDE 0.9 % IV SOLN: 250.0000 mL | INTRAVENOUS | Status: DC | PRN

## 2016-10-18 MED ORDER — MIDAZOLAM HCL 2 MG/2ML IJ SOLN
INTRAMUSCULAR | Status: DC | PRN
  Administered 2016-10-18 (×2): 1 mg via INTRAVENOUS

## 2016-10-18 MED ORDER — SODIUM CHLORIDE 0.9% FLUSH: 3.0000 mL | Freq: Two times a day (BID) | INTRAVENOUS | Status: DC

## 2016-10-18 MED ORDER — IOPAMIDOL (ISOVUE-370) INJECTION 76%
INTRAVENOUS | Status: AC
  Filled 2016-10-18: qty 100

## 2016-10-18 MED ORDER — NITROGLYCERIN 0.4 MG SL SUBL: 0.4000 mg | SUBLINGUAL_TABLET | SUBLINGUAL | 12 refills | Status: AC | PRN

## 2016-10-18 MED ORDER — SODIUM CHLORIDE 0.9 % IV SOLN: INTRAVENOUS | Status: AC

## 2016-10-18 MED ORDER — FENTANYL CITRATE (PF) 100 MCG/2ML IJ SOLN
INTRAMUSCULAR | Status: AC
  Filled 2016-10-18: qty 2

## 2016-10-18 MED ORDER — HYDRALAZINE HCL 20 MG/ML IJ SOLN
10.0000 mg | Freq: Once | INTRAMUSCULAR | Status: AC
  Administered 2016-10-18: 10 mg via INTRAVENOUS

## 2016-10-18 MED ORDER — OXYCODONE-ACETAMINOPHEN 5-325 MG PO TABS: 1.0000 | ORAL_TABLET | ORAL | Status: DC | PRN

## 2016-10-18 MED ORDER — LIDOCAINE HCL (PF) 1 % IJ SOLN
INTRAMUSCULAR | Status: DC | PRN
Start: 2016-10-18 — End: 2016-10-18
  Administered 2016-10-18: 14 mL

## 2016-10-18 MED ORDER — HEPARIN (PORCINE) IN NACL 2-0.9 UNIT/ML-% IJ SOLN
INTRAMUSCULAR | Status: AC
  Filled 2016-10-18: qty 1000

## 2016-10-18 MED ORDER — HEPARIN SODIUM (PORCINE) 1000 UNIT/ML IJ SOLN
INTRAMUSCULAR | Status: AC
  Filled 2016-10-18: qty 1

## 2016-10-18 MED ORDER — FERROUS SULFATE 325 (65 FE) MG PO TABS: 325.0000 mg | ORAL_TABLET | Freq: Three times a day (TID) | ORAL | 3 refills | Status: DC

## 2016-10-18 MED ORDER — HEPARIN SODIUM (PORCINE) 1000 UNIT/ML IJ SOLN
INTRAMUSCULAR | Status: DC | PRN
  Administered 2016-10-18: 2000 [IU] via INTRAVENOUS

## 2016-10-18 MED ORDER — LIDOCAINE HCL (PF) 1 % IJ SOLN
INTRAMUSCULAR | Status: AC
  Filled 2016-10-18: qty 30

## 2016-10-18 MED ORDER — ONDANSETRON HCL 4 MG/2ML IJ SOLN: 4.0000 mg | Freq: Four times a day (QID) | INTRAMUSCULAR | Status: DC | PRN

## 2016-10-18 MED ORDER — FENTANYL CITRATE (PF) 100 MCG/2ML IJ SOLN
INTRAMUSCULAR | Status: DC | PRN
  Administered 2016-10-18 (×2): 25 ug via INTRAVENOUS

## 2016-10-18 MED ORDER — MIDAZOLAM HCL 2 MG/2ML IJ SOLN
INTRAMUSCULAR | Status: AC
  Filled 2016-10-18: qty 2

## 2016-10-18 MED ORDER — IOPAMIDOL (ISOVUE-370) INJECTION 76%
INTRAVENOUS | Status: DC | PRN
Start: 2016-10-18 — End: 2016-10-18
  Administered 2016-10-18: 70 mL via INTRA_ARTERIAL

## 2016-10-18 SURGICAL SUPPLY — 8 items
CATH INFINITI 5FR JL5 (CATHETERS) ×1 IMPLANT
CATH INFINITI 5FR MULTPACK ANG (CATHETERS) ×1 IMPLANT
KIT HEART LEFT (KITS) ×2 IMPLANT
PACK CARDIAC CATHETERIZATION (CUSTOM PROCEDURE TRAY) ×2 IMPLANT
SHEATH PINNACLE 5F 10CM (SHEATH) ×1 IMPLANT
SYR MEDRAD MARK V 150ML (SYRINGE) ×2 IMPLANT
TRANSDUCER W/STOPCOCK (MISCELLANEOUS) ×2 IMPLANT
WIRE EMERALD 3MM-J .035X150CM (WIRE) ×1 IMPLANT

## 2016-10-18 NOTE — Progress Notes (Signed)
Spoke with Dr. Sharyn Lull this afternoon who stated patient would need 4 hours bed rest. Bed rest over at 1750. R groin a level 0. Pt ambulated in the room. Will cont to monitor pt.

## 2016-10-18 NOTE — Progress Notes (Signed)
Subjective:  Doing well denies any chest pain or shortness of breath. Tolerated left cardiac catheterization earlier this a.m. noted to have patent LAD stent.  Objective:  Vital Signs in the last 24 hours: Temp:  [98.1 F (36.7 C)-98.5 F (36.9 C)] 98.1 F (36.7 C) (04/20 0400) Pulse Rate:  [58-79] 73 (04/20 1415) Resp:  [10-23] 15 (04/20 1345) BP: (129-207)/(67-112) 150/69 (04/20 1530) SpO2:  [95 %-100 %] 98 % (04/20 1530) Weight:  [180 lb (81.6 kg)-187 lb (84.8 kg)] 187 lb (84.8 kg) (04/19 1959)  Intake/Output from previous day: No intake/output data recorded. Intake/Output from this shift: Total I/O In: -  Out: 1900 [Urine:1900]  Physical Exam: Neck: no adenopathy, no carotid bruit, no JVD and supple, symmetrical, trachea midline Lungs: clear to auscultation bilaterally Heart: regular rate and rhythm, S1, S2 normal and Soft systolic murmur noted Abdomen: soft, non-tender; bowel sounds normal; no masses,  no organomegaly Extremities: extremities normal, atraumatic, no cyanosis or edema and Right groin stable  Lab Results:  Recent Labs  10/17/16 2111 10/18/16 0242  WBC 4.8 4.9  HGB 10.1* 10.2*  PLT 230 215    Recent Labs  10/17/16 2111 10/18/16 0242  NA 144 141  K 4.3 3.9  CL 107 108  CO2 29 24  GLUCOSE 165* 85  BUN 18 17  CREATININE 1.36* 1.12    Recent Labs  10/18/16 0242 10/18/16 0937  TROPONINI <0.03 <0.03   Hepatic Function Panel  Recent Labs  10/17/16 2111  PROT 5.9*  ALBUMIN 3.3*  AST 17  ALT 11*  ALKPHOS 60  BILITOT 0.5    Recent Labs  10/18/16 0242  CHOL 151   No results for input(s): PROTIME in the last 72 hours.  Imaging: Imaging results have been reviewed and Dg Chest 2 View  Result Date: 10/17/2016 CLINICAL DATA:  Onset of left-sided chest pain. No shortness of breath. History of coronary artery disease and COPD. Patient is a current smoker. EXAM: CHEST  2 VIEW COMPARISON:  Chest x-ray of February 05, 2015 FINDINGS: The  lungs are mildly hyperinflated. There is no focal infiltrate. There is no pleural effusion or pneumothorax. The heart and pulmonary vascularity are normal. The mediastinum is normal in width. There is calcification in the wall of the ascending aorta. The bony thorax exhibits no acute abnormality. IMPRESSION: COPD. There is no pneumonia, CHF, nor other acute cardiopulmonary abnormality. Thoracic aortic atherosclerosis. Electronically Signed   By: David  [place] M.D.   On: 10/17/2016 16:55    Cardiac Studies:  Assessment/Plan:  Stable angina MI ruled out status post left cardiac catheterization Coronary artery disease status post PCI to LAD in the remote past Hypertension Hyperlipidemia Compensated congestive heart failure secondary to systolic/diastolic dysfunction COPD Tobacco abuse History of CVA Chronic kidney disease stage II improved Anemia of chronic disease  Plan Continue present management Will DC home later this evening Post cardiac cath instructions have been given  LOS: 1 day    Rinaldo Cloud 10/18/2016, 4:17 PM

## 2016-10-18 NOTE — Interval H&P Note (Signed)
Cath Lab Visit (complete for each Cath Lab visit)  Clinical Evaluation Leading to the Procedure:   ACS: Yes.    Non-ACS:    Anginal Classification: CCS IV  Anti-ischemic medical therapy: Maximal Therapy (2 or more classes of medications)  Non-Invasive Test Results: No non-invasive testing performed  Prior CABG: No previous CABG      History and Physical Interval Note:  10/18/2016 11:57 AM  Randy Walter  has presented today for surgery, with the diagnosis of unstable angina  The various methods of treatment have been discussed with the patient and family. After consideration of risks, benefits and other options for treatment, the patient has consented to  Procedure(s): Left Heart Cath and Coronary Angiography (N/A) as a surgical intervention .  The patient's history has been reviewed, patient examined, no change in status, stable for surgery.  I have reviewed the patient's chart and labs.  Questions were answered to the patient's satisfaction.     Rinaldo Cloud

## 2016-10-18 NOTE — Progress Notes (Signed)
Site area: Right groin a 5 french arterial sheath was removed  Site Prior to Removal:  Level 0  Pressure Applied For 15 MINUTES    Bedrest Beginning at 1350p  Manual:   Yes.    Patient Status During Pull:  stable  Post Pull Groin Site:  Level 0  Post Pull Instructions Given:  Yes.    Post Pull Pulses Present:  Yes.    Dressing Applied:  Yes.    Comments:  VS remain stable during sheath pull

## 2016-10-18 NOTE — Plan of Care (Signed)
Problem: Safety: Goal: Ability to remain free from injury will improve Outcome: Progressing Fall risk bundle in place this shift. No falls or injuries. Will continue to monitor pt and maintain pt safety.

## 2016-10-18 NOTE — Progress Notes (Signed)
ANTICOAGULATION CONSULT NOTE - Follow Up Consult  Pharmacy Consult for heparin Indication: chest pain/ACS  Labs:  Recent Labs  10/17/16 1540 10/17/16 2111 10/18/16 0242 10/18/16 0320 10/18/16 0937  HGB 10.7* 10.1* 10.2*  --   --   HCT 34.4* 33.0* 32.9*  --   --   PLT 244 230 215  --   --   LABPROT  --  14.2  --   --   --   INR  --  1.09  --   --   --   HEPARINUNFRC  --   --   --  0.73* 0.77*  CREATININE 1.42* 1.36* 1.12  --   --   TROPONINI  --  <0.03 <0.03  --   --      Assessment: 71yo male above goal on heparin with dosing for CP.  Goal of Therapy:  Heparin level 0.3-0.7 units/ml   Plan:  Will decrease heparin gtt slightly to 750 units/hr Hep lvl 1800  Isaac Bliss, PharmD, BCPS, [facility] Clinical Pharmacist 10/18/2016 10:28 AM

## 2016-10-18 NOTE — Discharge Summary (Signed)
Discharge summary dictated on 10/18/2016 dictation number is [phone]

## 2016-10-18 NOTE — Progress Notes (Signed)
ANTICOAGULATION CONSULT NOTE - Follow Up Consult  Pharmacy Consult for heparin Indication: chest pain/ACS  Labs:  Recent Labs  10/17/16 1540 10/17/16 2111 10/18/16 0242 10/18/16 0320  HGB 10.7* 10.1* 10.2*  --   HCT 34.4* 33.0* 32.9*  --   PLT 244 230 215  --   LABPROT  --  14.2  --   --   INR  --  1.09  --   --   HEPARINUNFRC  --   --   --  0.73*  CREATININE 1.42* 1.36*  --   --   TROPONINI  --  <0.03  --   --      Assessment: 71yo male above goal on heparin with initial dosing for CP.  Goal of Therapy:  Heparin level 0.3-0.7 units/ml   Plan:  Will decrease heparin gtt slightly to 900 units/hr and check level with next lab draw.  Vernard Gambles, PharmD, BCPS  10/18/2016,4:02 AM

## 2016-10-18 NOTE — Discharge Instructions (Signed)
Angina Pectoris °Angina pectoris, often called angina, is extreme discomfort in the chest, neck, or arm. This is caused by a lack of blood in the middle and thickest layer of the heart wall (myocardium). There are four types of angina: °· Stable angina. Stable angina usually occurs in episodes of predictable frequency and duration. It is usually brought on by physical activity, stress, or excitement. Stable angina usually lasts a few minutes and can often be relieved by a medicine that you place under your tongue. This medicine is called sublingual nitroglycerin. °· Unstable angina. Unstable angina can occur even when you are doing little or no physical activity. It can even occur while you are sleeping or when you are at rest. It can suddenly increase in severity or frequency. It may not be relieved by sublingual nitroglycerin, and it can last up to 30 minutes. °· Microvascular angina. This type of angina is caused by a disorder of tiny blood vessels called arterioles. Microvascular angina is more common in women. The pain may be more severe and last longer than other types of angina pectoris. °· Prinzmetal or variant angina. This type of angina pectoris is rare and usually occurs when you are doing little or no physical activity. It especially occurs in the early morning hours. ° °What are the causes? °Atherosclerosis is the cause of angina. This is the buildup of fat and cholesterol (plaque) on the inside of the arteries. Over time, the plaque may narrow or block the artery, and this will lessen blood flow to the heart. Plaque can also become weak and break off within a coronary artery to form a clot and cause a sudden blockage. °What increases the risk? °Risk factors common to both men and women include: °· High cholesterol levels. °· High blood pressure (hypertension). °· Tobacco use. °· Diabetes. °· Family history of angina. °· Obesity. °· Lack of exercise. °· A diet high in saturated fats. ° °Women are at  greater risk for angina if they are: °· Over age 55. °· Postmenopausal. ° °What are the signs or symptoms? °Many people do not experience any symptoms during the early stages of angina. As the condition progresses, symptoms common to both men and women may include: °· Chest pain. °? The pain can be described as a crushing or squeezing in the chest, or a tightness, pressure, fullness, or heaviness in the chest. °? The pain can last more than a few minutes, or it can stop and recur. °· Pain in the arms, neck, jaw, or back. °· Unexplained heartburn or indigestion. °· Shortness of breath. °· Nausea. °· Sudden cold sweats. °· Sudden light-headedness. ° °Many women have chest discomfort and some of the other symptoms. However, women often have different (atypical) symptoms, such as: °· Fatigue. °· Unexplained feelings of nervousness or anxiety. °· Unexplained weakness. °· Dizziness or fainting. ° °Sometimes, women may have angina without any symptoms. °How is this diagnosed? °Tests to diagnose angina may include: °· ECG (electrocardiogram). °· Exercise stress test. This looks for signs of blockage when the heart is being exercised. °· Pharmacologic stress test. This test looks for signs of blockage when the heart is being stressed with a medicine. °· Blood tests. °· Coronary angiogram. This is a procedure to look at the coronary arteries to see if there is any blockage. ° °How is this treated? °The treatment of angina may include the following: °· Healthy behavioral changes to reduce or control risk factors. °· Medicine. °· Coronary stenting. A stent helps   Coronary angioplasty. This procedure widens a narrowed or blocked artery.  Coronary arterybypass surgery. This will allow your blood to pass the blockage (bypass) to reach your heart. Follow these instructions at home:  Take medicines only as directed by your health care provider.  Do not take the following medicines unless your health care provider  approves:  Nonsteroidal anti-inflammatory drugs (NSAIDs), such as ibuprofen, naproxen, or celecoxib.  Vitamin supplements that contain vitamin A, vitamin E, or both.  Hormone replacement therapy that contains estrogen with or without progestin.  Manage other health conditions such as hypertension and diabetes as directed by your health care provider.  Follow a heart-healthy diet. A dietitian can help to educate you about healthy food options and changes.  Use healthy cooking methods such as roasting, grilling, broiling, baking, poaching, steaming, or stir-frying. Talk to a dietitian to learn more about healthy cooking methods.  Follow an exercise program approved by your health care provider.  Maintain a healthy weight. Lose weight as approved by your health care provider.  Plan rest periods when fatigued.  Learn to manage stress.  Do not use any tobacco products, including cigarettes, chewing tobacco, or electronic cigarettes. If you need help quitting, ask your health care provider.  If you drink alcohol, and your health care provider approves, limit your alcohol intake to no more than 1 drink per day. One drink equals 12 ounces of beer, 5 ounces of wine, or 1 ounces of hard liquor.  Stop illegal drug use.  Keep all follow-up visits as directed by your health care provider. This is important. Get help right away if:  You have pain in your chest, neck, arm, jaw, stomach, or back that lasts more than a few minutes, is recurring, or is unrelieved by taking sublingualnitroglycerin.  You have profuse sweating without cause.  You have unexplained:  Heartburn or indigestion.  Shortness of breath or difficulty breathing.  Nausea or vomiting.  Fatigue.  Feelings of nervousness or anxiety.  Weakness.  Diarrhea.  You have sudden light-headedness or dizziness.  You faint. These symptoms may represent a serious problem that is an emergency. Do not wait to see if the  symptoms will go away. Get medical help right away. Call your local emergency services (911 in the [place] Do not drive yourself to the hospital. This information is not intended to replace advice given to you by your health care provider. Make sure you discuss any questions you have with your health care provider. Document Released: 06/17/2005 Document Revised: 11/29/2015 Document Reviewed: 10/19/2013 Elsevier Interactive Patient Education  2017 [employer] Coronary Angiogram A coronary angiogram is an X-ray procedure that is used to examine the arteries in the heart. In this procedure, a dye (contrast dye) is injected through a long, thin tube (catheter). The catheter is inserted through the groin, wrist, or arm. The dye is injected into each artery, then X-rays are taken to show if there is a blockage in the arteries of the heart. This procedure can also show if you have valve disease or a disease of the aorta, and it can be used to check the overall function of your heart muscle. You may have a coronary angiogram if:  You are having chest pain, or other symptoms of angina, and you are at risk for heart disease.  You have an abnormal electrocardiogram (ECG) or stress test.  You have chest pain and heart failure.  You are having irregular heart rhythms.  You and your health care provider determine  that the benefits of the test information outweigh the risks of the procedure. Let your health care provider know about:  Any allergies you have, including allergies to contrast dye.  All medicines you are taking, including vitamins, herbs, eye drops, creams, and over-the-counter medicines.  Any problems you or family members have had with anesthetic medicines.  Any blood disorders you have.  Any surgeries you have had.  History of kidney problems or kidney failure.  Any medical conditions you have.  Whether you are pregnant or may be pregnant. What are the risks? Generally, this is a  safe procedure. However, problems may occur, including:  Infection.  Allergic reaction to medicines or dyes that are used.  Bleeding from the access site or other locations.  Kidney injury, especially in people with impaired kidney function.  Stroke (rare).  Heart attack (rare).  Damage to other structures or organs. What happens before the procedure? Staying hydrated  Follow instructions from your health care provider about hydration, which may include:  Up to 2 hours before the procedure - you may continue to drink clear liquids, such as water, clear fruit juice, black coffee, and plain tea. Eating and drinking restrictions  Follow instructions from your health care provider about eating and drinking, which may include:  8 hours before the procedure - stop eating heavy meals or foods such as meat, fried foods, or fatty foods.  6 hours before the procedure - stop eating light meals or foods, such as toast or cereal.  2 hours before the procedure - stop drinking clear liquids. General instructions   Ask your health care provider about:  Changing or stopping your regular medicines. This is especially important if you are taking diabetes medicines or blood thinners.  Taking medicines such as ibuprofen. These medicines can thin your blood. Do not take these medicines before your procedure if your health care provider instructs you not to, though aspirin may be recommended prior to coronary angiograms.  Plan to have someone take you home from the hospital or clinic.  You may need to have blood tests or X-rays done. What happens during the procedure?  An IV tube will be inserted into one of your veins.  You will be given one or more of the following:  A medicine to help you relax (sedative).  A medicine to numb the area where the catheter will be inserted into an artery (local anesthetic).  To reduce your risk of infection:  Your health care team will wash or sanitize  their hands.  Your skin will be washed with soap.  Hair may be removed from the area where the catheter will be inserted.  You will be connected to a continuous ECG monitor.  The catheter will be inserted into an artery. The location may be in your groin, in your wrist, or in the fold of your arm (near your elbow).  A type of X-ray (fluoroscopy) will be used to help guide the catheter to the opening of the blood vessel that is being examined.  A dye will be injected into the catheter, and X-rays will be taken. The dye will help to show where any narrowing or blockages are located in the heart arteries.  Tell your health care provider if you have any chest pain or trouble breathing during the procedure.  If blockages are found, your health care provider may perform another procedure, such as inserting a coronary stent. The procedure may vary among health care providers and hospitals. What happens  after the procedure?  After the procedure, you will need to keep the area still for a few hours, or for as long as told by your health care provider. If the procedure is done through the groin, you will be instructed to not bend and not cross your legs.  The insertion site will be checked frequently.  The pulse in your foot or wrist will be checked frequently.  You may have additional blood tests, X-rays, and a test that records the electrical activity of your heart (ECG).  Do not drive for 24 hours if you were given a sedative. Summary  A coronary angiogram is an X-ray procedure that is used to look into the arteries in the heart.  During the procedure, a dye (contrast dye) is injected through a long, thin tube (catheter). The catheter is inserted through the groin, wrist, or arm.  Tell your health care provider about any allergies you have, including allergies to contrast dye.  After the procedure, you will need to keep the area still for a few hours, or for as long as told by your  health care provider. This information is not intended to replace advice given to you by your health care provider. Make sure you discuss any questions you have with your health care provider. Document Released: 12/22/2002 Document Revised: 03/29/2016 Document Reviewed: 03/29/2016 Elsevier Interactive Patient Education  2017 [employer].

## 2016-10-18 NOTE — Discharge Summary (Signed)
Randy Walter, Randy Walter NO.:  [id number]  MEDICAL RECORD NO.:  [id number]  LOCATION:  [facility]                         FACILITY:  [facility]  PHYSICIAN:  Jentry Mcqueary N. Sharyn Lull, M.D. DATE OF BIRTH:  05/16/1946  DATE OF ADMISSION:  10/17/2016 DATE OF DISCHARGE:  10/18/2016                              DISCHARGE SUMMARY   ADMITTING DIAGNOSES: 1. Acute coronary syndrome. 2. Coronary artery disease, status post percutaneous coronary     intervention to left anterior descending in remote past. 3. Hypertension. 4. Hyperlipidemia. 5. Compensated congestive heart failure secondary to     systolic/diastolic dysfunction. 6. Chronic obstructive pulmonary disease. 7. Tobacco abuse. 8. History of cerebrovascular accident. 9. Chronic kidney disease, stage 2. 10.Anemia of chronic disease.  DISCHARGE DIAGNOSES: 1. Stable angina, myocardial infarction ruled out, status post left     cardiac catheterization.  Noted to have patent left anterior     descending stent. 2. Coronary artery disease, status post percutaneous coronary     intervention to left anterior descending in remote past. 3. Hypertension. 4. Hyperlipidemia. 5. Compensated congestive heart failure secondary to preserved left     ventricular systolic function. 6. Chronic obstructive pulmonary disease. 7. Tobacco abuse. 8. History of cerebrovascular accident. 9. Chronic kidney disease, stage 2, improved. 10.Anemia of chronic disease.  DISCHARGE HOME MEDICATIONS: 1. Ferrous sulfate 325 mg tablet three times daily. 2. Nitrostat sublingual p.r.n. 3. Amlodipine 5 mg 1 tablet daily. 4. Aspirin 81 mg daily. 5. Atorvastatin 10 mg daily. 6. Carvedilol 3.125 mg twice daily. 7. Lisinopril 20 mg 1 tablet twice daily. The patient has been advised to stop furosemide.  DIET:  Low-salt, low-cholesterol diet.  Postcardiac cath instructions have been given.  Follow up with me in 1 week.  Condition at discharge is stable.  BRIEF  HISTORY AND HOSPITAL COURSE:  Randy Walter is a 71 year old male with past medical history significant for coronary artery disease, history of PTCA stenting to mid LAD in 2007, hypertension, history of congestive heart failure secondary to systolic/diastolic dysfunction, hyperlipidemia, tobacco abuse, COPD, history of cerebrovascular accident in the past.  He came to the ER complaining of retrosternal chest pain, described as pressure grade 5 to 6/10, radiating to left arm associated with mild shortness of breath.  The patient denies any nausea, vomiting, diaphoresis.  Denies PND, orthopnea, or leg swelling.  Denies palpitation, lightheadedness, or syncope.  The patient also gives a history of exertional dyspnea and claudication pain in the legs.  Denies any recent cardiac workup.  PHYSICAL EXAMINATION:  GENERAL:  On examination, he was alert, awake, oriented x3, in no acute distress. VITAL SIGNS:  Blood pressure was 144/85, pulse 63.  He was afebrile. HEENT:  Conjunctivae were pink. NECK:  Supple.  No JVD.  No bruit. LUNGS:  Clear to auscultation without rhonchi or rales. CARDIOVASCULAR:  S1 and S2 were normal.  There was soft systolic murmur. ABDOMEN:  Soft.  Bowel sounds are present.  Nontender. EXTREMITIES:  There is no clubbing, cyanosis, or edema.  LABORATORY DATA:  His sodium was 141, potassium 4.5, BUN 17, creatinine 1.42.  Repeat electrolytes; sodium was 144, potassium 4.3, BUN 18, creatinine 1.36, this morning BUN  is 17, creatinine 1.12.  Three sets of troponin-I were normal.  His total cholesterol was 151, HDL 42, LDL was 97, triglycerides 61.  Hemoglobin was 10.7, hematocrit 34.4, white count of 4.4.  This morning, hemoglobin is 10.2, hematocrit 32.9, white count 4.9, which has been stable.  His fasting blood sugar this morning was 85.  EKG showed normal sinus rhythm with nonspecific T-wave changes.  BRIEF HOSPITAL COURSE:  The patient was admitted to telemetry unit.   MI was ruled out by serial enzymes and EKG.  The patient subsequently underwent left cardiac cath.  As per procedure report, the patient tolerated the procedure well.  There were no complications. Postprocedure, the patient did not have any episodes of chest pain.  His groin is stable with no evidence of hematoma or bruit.  The patient will be discharged home later this evening if he remains hemodynamically stable.     [name], M.D.     [facility]  D:  10/18/2016  T:  10/18/2016  Job:  [id number]

## 2016-10-18 NOTE — Progress Notes (Addendum)
Discharge instructions reviewed with pt. Pt stated he has no questions at this time. Pt stated that he would schedule an appointment with Dr. Sharyn Lull once he was discharged. IV d/c. Pt states he feels fine and is ready to be discharged.  Pt's R groin a level 0. Pt has family who will help with his transportation home and to follow up appointments.

## 2017-03-10 ENCOUNTER — Ambulatory Visit: Payer: Self-pay | Admitting: Surgery

## 2017-03-10 NOTE — H&P (Signed)
History of Present Illness Randy Walter(Anik Wesch K. Malikye Reppond MD; 03/10/2017 9:52 AM) The patient is a 71 year old male who presents with an inguinal hernia. Referred by Dr. Rinaldo CloudMohan Harwani for right inguinal hernia  This is a 71 year old male who is status post coronary stenting who presents with about a month history of a visible bulge in his right groin associated with some pain and burning. This area has become fairly uncomfortable. He remains reducible. He denies any obstructive symptoms. He brought this to the attention of Dr. Sharyn LullHarwani who diagnosed a right inguinal hernia. The patient is now referred for surgical evaluation. He is accompanied by a cardiac clearance note  The patient was hospitalized in April of this year to rule out MI. He is currently only on aspirin for blood thinner.      Past Surgical History (Tanisha A. Manson PasseyBrown, RMA; 03/10/2017 9:13 AM) No pertinent past surgical history  Diagnostic Studies History (Tanisha A. Manson PasseyBrown, RMA; 03/10/2017 9:13 AM) Colonoscopy never  Allergies (Tanisha A. Manson PasseyBrown, RMA; 03/10/2017 9:14 AM) No Known Drug Allergies 03/10/2017 Allergies Reconciled  Medication History (Tanisha A. Manson PasseyBrown, RMA; 03/10/2017 9:18 AM) AmLODIPine Besylate (5MG  Tablet, Oral) Active. Lisinopril (20MG  Tablet, Oral) Active. Ferrous Fumarate (325 (106 Fe)MG Tablet, Oral) Active. Nitroglycerin (0.4MG  Tab Sublingual, Sublingual) Active. Carvedilol (3.125MG  Tablet, Oral) Active. Aspirin (81MG  Tablet, Oral) Active. Atorvastatin Calcium (10MG  Tablet, Oral) Active. Medications Reconciled  Social History (Tanisha A. Manson PasseyBrown, RMA; 03/10/2017 9:13 AM) Alcohol use Recently quit alcohol use. Caffeine use Carbonated beverages, Coffee, Tea. No drug use Tobacco use Current some day smoker.  Family History (Tanisha A. Manson PasseyBrown, RMA; 03/10/2017 9:13 AM) Breast Cancer Daughter.  Other Problems (Tanisha A. Manson PasseyBrown, RMA; 03/10/2017 9:13 AM) Cerebrovascular Accident High blood  pressure     Review of Systems (Tanisha A. Brown RMA; 03/10/2017 9:13 AM) General Present- Fatigue. Not Present- Appetite Loss, Chills, Fever, Night Sweats, Weight Gain and Weight Loss. Skin Not Present- Change in Wart/Mole, Dryness, Hives, Jaundice, New Lesions, Non-Healing Wounds, Rash and Ulcer. HEENT Not Present- Earache, Hearing Loss, Hoarseness, Nose Bleed, Oral Ulcers, Ringing in the Ears, Seasonal Allergies, Sinus Pain, Sore Throat, Visual Disturbances, Wears glasses/contact lenses and Yellow Eyes. Breast Not Present- Breast Mass, Breast Pain, Nipple Discharge and Skin Changes. Cardiovascular Present- Shortness of Breath. Not Present- Chest Pain, Difficulty Breathing Lying Down, Leg Cramps, Palpitations, Rapid Heart Rate and Swelling of Extremities. Gastrointestinal Present- Abdominal Pain and Constipation. Not Present- Bloating, Bloody Stool, Change in Bowel Habits, Chronic diarrhea, Difficulty Swallowing, Excessive gas, Gets full quickly at meals, Hemorrhoids, Indigestion, Nausea, Rectal Pain and Vomiting. Male Genitourinary Present- Frequency. Not Present- Blood in Urine, Change in Urinary Stream, Impotence, Nocturia, Painful Urination, Urgency and Urine Leakage. Musculoskeletal Present- Muscle Weakness. Not Present- Back Pain, Joint Pain, Joint Stiffness, Muscle Pain and Swelling of Extremities. Neurological Present- Weakness. Not Present- Decreased Memory, Fainting, Headaches, Numbness, Seizures, Tingling, Tremor and Trouble walking. Endocrine Present- Heat Intolerance. Not Present- Cold Intolerance, Excessive Hunger, Hair Changes, Hot flashes and New Diabetes.  Vitals (Tanisha A. Brown RMA; 03/10/2017 9:14 AM) 03/10/2017 9:14 AM Weight: 180.6 lb Height: 73in Body Surface Area: 2.06 m Body Mass Index: 23.83 kg/m  Temp.: 97.54F  Pulse: 94 (Regular)  P.OX: 98% (Room air) BP: 138/82 (Sitting, Left Arm, Standard)      Physical Exam Molli Hazard(Tyria Springer K. Commodore Bellew MD; 03/10/2017 9:53  AM)  The physical exam findings are as follows: Note:WDWN in NAD Eyes: Pupils equal, round; sclera anicteric HENT: Oral mucosa moist; good dentition Neck: No masses palpated, no  thyromegaly Lungs: CTA bilaterally; normal respiratory effort CV: Regular rate and rhythm; no murmurs; extremities well-perfused with no edema Abd: +bowel sounds, soft, non-tender, no palpable organomegaly; visible palpable umbilical hernia - reducible GU: bilateral descended testes; no testicular masses; large right inguinal bulge - reducible; tenderness radiates towards right testicle; no sign of left inguinal hernia Skin: Warm, dry; no sign of jaundice Psychiatric - alert and oriented x 4; calm mood and affect    Assessment & Plan Molli Hazard K. Chakara Bognar MD; 03/10/2017 9:43 AM)  RIGHT INGUINAL HERNIA (K40.90)  UMBILICAL HERNIA (K42.9)  Current Plans Schedule for Surgery - Right inguinal hernia repair with mesh/ umbilical hernia repair with mesh. The surgical procedure has been discussed with the patient. Potential risks, benefits, alternative treatments, and expected outcomes have been explained. All of the patient's questions at this time have been answered. The likelihood of reaching the patient's treatment goal is good. The patient understand the proposed surgical procedure and wishes to proceed.  Randy Walter. Corliss Skains, MD, [facility] Surgery  General/ Trauma Surgery  03/10/2017 9:54 AM

## 2017-04-02 ENCOUNTER — Other Ambulatory Visit (HOSPITAL_COMMUNITY): Payer: Self-pay | Admitting: *Deleted

## 2017-04-02 ENCOUNTER — Encounter (HOSPITAL_COMMUNITY): Payer: Self-pay

## 2017-04-02 NOTE — Pre-Procedure Instructions (Addendum)
    Randy Walter  04/02/2017      [facility] [place], [place] - [address] [place] [address] Phone: [phone] Fax: [phone]   Your procedure is scheduled on Tuesday, April 08, 2017 at 8:45 AM.   Report to [facility] Main Entrance "A" Admitting Office at 6:45 AM.   Call this number if you have problems the morning of surgery: [phone]   Questions prior to day of surgery, please call [phone] between 8 & 4 PM.   Remember:  Do not eat food or drink liquids after midnight Monday, 04/07/17.  Take these medicines the morning of surgery with A SIP OF WATER:  Stop Aspirin as instructed by surgeon or cardiologist. Do not use NSAIDS (Ibuprofen, Aleve, etc) prior to surgery.  Drink "Boost" 2 hours prior to arrival to the hospital. Drink it all by 4:45 AM day of surgery.  Do NOT smoke 24 hours prior to surgery.   Do not wear jewelry.  Do not wear lotions, powders, cologne or deodorant.  Men may shave face and neck.  Do not bring valuables to the hospital.  [facility] is not responsible for any belongings or valuables.  Contacts, dentures or bridgework may not be worn into surgery.  Leave your suitcase in the car.  After surgery it may be brought to your room.  For patients admitted to the hospital, discharge time will be determined by your treatment team.  Patients discharged the day of surgery will not be allowed to drive home.   See "Preparing for Surgery" Instruction sheet.  Please read over the fact sheets that you were given.

## 2017-04-03 ENCOUNTER — Encounter (HOSPITAL_COMMUNITY)
Admission: RE | Admit: 2017-04-03 | Discharge: 2017-04-03 | Disposition: A | Discharge status: Home / Self Care | Payer: Medicare Other | Source: Ambulatory Visit | Attending: Surgery | Admitting: Surgery

## 2017-04-03 ENCOUNTER — Encounter (HOSPITAL_COMMUNITY): Payer: Self-pay

## 2017-04-03 DIAGNOSIS — J449 Chronic obstructive pulmonary disease, unspecified: Secondary | ICD-10-CM | POA: Diagnosis not present

## 2017-04-03 DIAGNOSIS — K4090 Unilateral inguinal hernia, without obstruction or gangrene, not specified as recurrent: Secondary | ICD-10-CM | POA: Insufficient documentation

## 2017-04-03 DIAGNOSIS — Z01818 Encounter for other preprocedural examination: Secondary | ICD-10-CM | POA: Diagnosis present

## 2017-04-03 DIAGNOSIS — Z79899 Other long term (current) drug therapy: Secondary | ICD-10-CM | POA: Diagnosis not present

## 2017-04-03 DIAGNOSIS — I509 Heart failure, unspecified: Secondary | ICD-10-CM | POA: Insufficient documentation

## 2017-04-03 DIAGNOSIS — Z01812 Encounter for preprocedural laboratory examination: Secondary | ICD-10-CM | POA: Insufficient documentation

## 2017-04-03 DIAGNOSIS — Z7982 Long term (current) use of aspirin: Secondary | ICD-10-CM | POA: Insufficient documentation

## 2017-04-03 DIAGNOSIS — I110 Hypertensive heart disease with heart failure: Secondary | ICD-10-CM | POA: Diagnosis not present

## 2017-04-03 DIAGNOSIS — F17200 Nicotine dependence, unspecified, uncomplicated: Secondary | ICD-10-CM | POA: Diagnosis not present

## 2017-04-03 DIAGNOSIS — K429 Umbilical hernia without obstruction or gangrene: Secondary | ICD-10-CM | POA: Insufficient documentation

## 2017-04-03 DIAGNOSIS — E785 Hyperlipidemia, unspecified: Secondary | ICD-10-CM | POA: Diagnosis not present

## 2017-04-03 DIAGNOSIS — I2510 Atherosclerotic heart disease of native coronary artery without angina pectoris: Secondary | ICD-10-CM | POA: Diagnosis not present

## 2017-04-03 HISTORY — DX: Dyspnea, unspecified: R06.00

## 2017-04-03 LAB — COMPREHENSIVE METABOLIC PANEL
ALT: 11 U/L — AB (ref 17–63)
ANION GAP: 6 (ref 5–15)
AST: 14 U/L — ABNORMAL LOW (ref 15–41)
Albumin: 3.5 g/dL (ref 3.5–5.0)
Alkaline Phosphatase: 81 U/L (ref 38–126)
BUN: 12 mg/dL (ref 6–20)
CHLORIDE: 106 mmol/L (ref 101–111)
CO2: 27 mmol/L (ref 22–32)
CREATININE: 1.09 mg/dL (ref 0.61–1.24)
Calcium: 9.2 mg/dL (ref 8.9–10.3)
GFR calc non Af Amer: >60 mL/min (ref >60)
Glucose, Bld: 93 mg/dL (ref 65–99)
Potassium: 4.2 mmol/L (ref 3.5–5.1)
SODIUM: 139 mmol/L (ref 135–145)
Total Bilirubin: 0.5 mg/dL (ref 0.3–1.2)
Total Protein: 6.8 g/dL (ref 6.5–8.1)

## 2017-04-03 LAB — CBC
HCT: 38.3 % — ABNORMAL LOW (ref 39.0–52.0)
Hemoglobin: 11.8 g/dL — ABNORMAL LOW (ref 13.0–17.0)
MCH: 27.5 pg (ref 26.0–34.0)
MCHC: 30.8 g/dL (ref 30.0–36.0)
MCV: 89.3 fL (ref 78.0–100.0)
PLATELETS: 251 10*3/uL (ref 150–400)
RBC: 4.29 MIL/uL (ref 4.22–5.81)
RDW: 14.2 % (ref 11.5–15.5)
WBC: 5.3 10*3/uL (ref 4.0–10.5)

## 2017-04-03 NOTE — Pre-Procedure Instructions (Signed)
    Randy Walter  04/03/2017      [facility] [place], [place] - [address] [place] [address] Phone: [phone] Fax: [phone]   Your procedure is scheduled on Tuesday, April 08, 2017 at 8:45 AM.   Report to [facility] Main Entrance "A" Admitting Office at 6:45 AM.   Call this number if you have problems the morning of surgery: [phone]   Questions prior to day of surgery, please call [phone] between 8 & 4 PM.   Remember:  Do not eat food or drink liquids after midnight Monday, 04/07/17.  Take these medicines the morning of surgery with A SIP OF WATER:  CARVEDILOL   Stop Aspirin  TODAY as instructed by surgeon or cardiologist. Do not use NSAIDS (Ibuprofen, Aleve, etc) prior to surgery.    Drink "Boost" 2 hours prior to arrival to the hospital. Drink it all by 4:45 AM day of surgery.  Do NOT smoke 24 hours prior to surgery.   Do not wear jewelry.  Do not wear lotions, powders, cologne or deodorant.  Men may shave face and neck.  Do not bring valuables to the hospital.  [facility] is not responsible for any belongings or valuables.  Contacts, dentures or bridgework may not be worn into surgery.  Leave your suitcase in the car.  After surgery it may be brought to your room.  For patients admitted to the hospital, discharge time will be determined by your treatment team.  Patients discharged the day of surgery will not be allowed to drive home.   See "Preparing for Surgery" Instruction sheet.  Please read over the fact sheets that you were given.

## 2017-04-03 NOTE — Progress Notes (Signed)
SPOKE WITH SYLVIA AT DR. Fatima Sanger OFFICE WHO STATED PATIENT SHOULD STOP ASPIRIN 5 DAYS PRIOR TO SURGERY.  FAXED REQUEST TO DR. [facility] FOR CARDIAC CLEARANCE NOTE,  [phone]

## 2017-04-04 NOTE — Progress Notes (Signed)
Anesthesia Chart Review:  Pt is a 71 year old male scheduled for R inguinal hernia repair, umbilical hernia repair with mesh on 04/08/2017 with Manus Rudd, MD  - Cardiologist is Rinaldo Cloud, MD who cleared pt for surgery at last office visit 02/14/17.   PMH includes:  CAD (mild by 10/18/16 cath), CHF, stroke, HTN, hyperlipidemia, COPD. Current smoker. BMI 24  Medications include: ASA 81 mg, Lipitor, carvedilol, lisinopril  BP (!) 153/78   Pulse 79   Temp (!) 36.4 C   Resp 20   Ht  (1.854 m)   Wt 180 lb 1.6 oz (81.7 kg)   SpO2 98%   BMI 23.76 kg/m   Preoperative labs reviewed.    CXR 10/17/16:  - COPD. There is no pneumonia, CHF, nor other acute cardiopulmonary abnormality. - Thoracic aortic atherosclerosis.  EKG 10/18/16: Sinus rhythm with 1st degree A-V block. Minimal voltage criteria for LVH, may be normal variant  Cardiac cath 10/18/16:  - LV showed good LV systolic function EF 50-55% and LVH  Mid RCA lesion, 40 %stenosed.  Mid LAD lesion, 30 %stenosed.  Prox LAD lesion, 30 %stenosed.  Dist LAD lesion, 30 %stenosed.  1st Mrg lesion, 30 %stenosed.  Echo 02/07/15:  - Left ventricle: The cavity size was normal. Systolic function was normal. The estimated ejection fraction was in the range of 50% to 55%. Wall motion was normal; there were no regional wall motion abnormalities. There was an increased relative contribution of atrial contraction to ventricular filling. Doppler parameters are consistent with abnormal left ventricular relaxation (grade 1 diastolic dysfunction). - Aortic valve: Trileaflet; mildly thickened, moderately calcified leaflets.  If no changes, I anticipate pt can proceed with surgery as scheduled.   Rica Mast, FNP-BC [facility]/Anesthesiology Phone: [phone] 04/04/2017 9:47 AM

## 2017-04-08 ENCOUNTER — Ambulatory Visit (HOSPITAL_COMMUNITY): Payer: Medicare Other | Admitting: Emergency Medicine

## 2017-04-08 ENCOUNTER — Encounter (HOSPITAL_COMMUNITY)
Admission: RE | Disposition: A | Discharge status: Home / Self Care | Payer: Self-pay | Source: Ambulatory Visit | Attending: Surgery

## 2017-04-08 ENCOUNTER — Encounter (HOSPITAL_COMMUNITY): Payer: Self-pay

## 2017-04-08 ENCOUNTER — Ambulatory Visit (HOSPITAL_COMMUNITY)
Admission: RE | Admit: 2017-04-08 | Discharge: 2017-04-08 | Disposition: A | Discharge status: Home / Self Care | Payer: Medicare Other | Source: Ambulatory Visit | Attending: Surgery | Admitting: Surgery

## 2017-04-08 ENCOUNTER — Ambulatory Visit (HOSPITAL_COMMUNITY): Payer: Medicare Other | Admitting: Anesthesiology

## 2017-04-08 DIAGNOSIS — K4090 Unilateral inguinal hernia, without obstruction or gangrene, not specified as recurrent: Secondary | ICD-10-CM | POA: Insufficient documentation

## 2017-04-08 DIAGNOSIS — J449 Chronic obstructive pulmonary disease, unspecified: Secondary | ICD-10-CM | POA: Insufficient documentation

## 2017-04-08 DIAGNOSIS — Z7982 Long term (current) use of aspirin: Secondary | ICD-10-CM | POA: Insufficient documentation

## 2017-04-08 DIAGNOSIS — F17200 Nicotine dependence, unspecified, uncomplicated: Secondary | ICD-10-CM | POA: Insufficient documentation

## 2017-04-08 DIAGNOSIS — Z79899 Other long term (current) drug therapy: Secondary | ICD-10-CM | POA: Insufficient documentation

## 2017-04-08 DIAGNOSIS — I509 Heart failure, unspecified: Secondary | ICD-10-CM | POA: Insufficient documentation

## 2017-04-08 DIAGNOSIS — I110 Hypertensive heart disease with heart failure: Secondary | ICD-10-CM | POA: Insufficient documentation

## 2017-04-08 DIAGNOSIS — K429 Umbilical hernia without obstruction or gangrene: Secondary | ICD-10-CM | POA: Diagnosis not present

## 2017-04-08 HISTORY — PX: INSERTION OF MESH: SHX5868

## 2017-04-08 HISTORY — PX: INGUINAL HERNIA REPAIR: SHX194

## 2017-04-08 HISTORY — PX: UMBILICAL HERNIA REPAIR: SHX196

## 2017-04-08 SURGERY — REPAIR, HERNIA, INGUINAL, ADULT
Anesthesia: General | Site: Inguinal | Laterality: Right

## 2017-04-08 MED ORDER — CHLORHEXIDINE GLUCONATE CLOTH 2 % EX PADS: 6.0000 | MEDICATED_PAD | Freq: Once | CUTANEOUS | Status: DC

## 2017-04-08 MED ORDER — PROPOFOL 10 MG/ML IV BOLUS
INTRAVENOUS | Status: AC
  Filled 2017-04-08: qty 20

## 2017-04-08 MED ORDER — FENTANYL CITRATE (PF) 100 MCG/2ML IJ SOLN
100.0000 ug | Freq: Once | INTRAMUSCULAR | Status: AC
  Administered 2017-04-08: 50 ug via INTRAVENOUS

## 2017-04-08 MED ORDER — ACETAMINOPHEN 500 MG PO TABS
ORAL_TABLET | ORAL | Status: AC
  Administered 2017-04-08: 1000 mg via ORAL
  Filled 2017-04-08: qty 2

## 2017-04-08 MED ORDER — FENTANYL CITRATE (PF) 100 MCG/2ML IJ SOLN
INTRAMUSCULAR | Status: AC
  Administered 2017-04-08: 50 ug via INTRAVENOUS
  Filled 2017-04-08: qty 2

## 2017-04-08 MED ORDER — CEFAZOLIN SODIUM-DEXTROSE 2-4 GM/100ML-% IV SOLN
2.0000 g | INTRAVENOUS | Status: AC
  Administered 2017-04-08: 2 g via INTRAVENOUS

## 2017-04-08 MED ORDER — MIDAZOLAM HCL 2 MG/2ML IJ SOLN
2.0000 mg | Freq: Once | INTRAMUSCULAR | Status: AC
  Administered 2017-04-08: 1 mg via INTRAVENOUS

## 2017-04-08 MED ORDER — LACTATED RINGERS IV SOLN
INTRAVENOUS | Status: DC | PRN
  Administered 2017-04-08: 09:00:00 via INTRAVENOUS

## 2017-04-08 MED ORDER — HYDRALAZINE HCL 20 MG/ML IJ SOLN
INTRAMUSCULAR | Status: DC | PRN
  Administered 2017-04-08: 5 mg via INTRAVENOUS

## 2017-04-08 MED ORDER — GABAPENTIN 300 MG PO CAPS
300.0000 mg | ORAL_CAPSULE | ORAL | Status: AC
  Administered 2017-04-08: 300 mg via ORAL

## 2017-04-08 MED ORDER — BUPIVACAINE HCL (PF) 0.75 % IJ SOLN
INTRAMUSCULAR | Status: AC
  Filled 2017-04-08: qty 30

## 2017-04-08 MED ORDER — ACETAMINOPHEN 500 MG PO TABS
1000.0000 mg | ORAL_TABLET | ORAL | Status: AC
  Administered 2017-04-08: 1000 mg via ORAL

## 2017-04-08 MED ORDER — SUGAMMADEX SODIUM 200 MG/2ML IV SOLN
INTRAVENOUS | Status: DC | PRN
  Administered 2017-04-08: 160 mg via INTRAVENOUS

## 2017-04-08 MED ORDER — HYDROCODONE-ACETAMINOPHEN 5-325 MG PO TABS: 1.0000 | ORAL_TABLET | Freq: Four times a day (QID) | ORAL | 0 refills | Status: DC | PRN

## 2017-04-08 MED ORDER — 0.9 % SODIUM CHLORIDE (POUR BTL) OPTIME
TOPICAL | Status: DC | PRN
  Administered 2017-04-08: 1000 mL

## 2017-04-08 MED ORDER — FENTANYL CITRATE (PF) 250 MCG/5ML IJ SOLN
INTRAMUSCULAR | Status: AC
  Filled 2017-04-08: qty 5

## 2017-04-08 MED ORDER — BUPIVACAINE-EPINEPHRINE (PF) 0.25% -1:200000 IJ SOLN
INTRAMUSCULAR | Status: AC
Start: 2017-04-08 — End: 2017-04-08
  Filled 2017-04-08: qty 30

## 2017-04-08 MED ORDER — FENTANYL CITRATE (PF) 100 MCG/2ML IJ SOLN
INTRAMUSCULAR | Status: AC
  Filled 2017-04-08: qty 2

## 2017-04-08 MED ORDER — ONDANSETRON HCL 4 MG/2ML IJ SOLN: 4.0000 mg | Freq: Once | INTRAMUSCULAR | Status: DC | PRN

## 2017-04-08 MED ORDER — MIDAZOLAM HCL 2 MG/2ML IJ SOLN
INTRAMUSCULAR | Status: AC
  Administered 2017-04-08: 1 mg via INTRAVENOUS
  Filled 2017-04-08: qty 2

## 2017-04-08 MED ORDER — BUPIVACAINE HCL (PF) 0.75 % IJ SOLN
INTRAMUSCULAR | Status: DC | PRN
  Administered 2017-04-08: 10 mL via PERINEURAL

## 2017-04-08 MED ORDER — BUPIVACAINE-EPINEPHRINE 0.25% -1:200000 IJ SOLN
INTRAMUSCULAR | Status: DC | PRN
  Administered 2017-04-08: 20 mL

## 2017-04-08 MED ORDER — FENTANYL CITRATE (PF) 100 MCG/2ML IJ SOLN
25.0000 ug | INTRAMUSCULAR | Status: DC | PRN
  Administered 2017-04-08 (×3): 50 ug via INTRAVENOUS

## 2017-04-08 MED ORDER — ROCURONIUM BROMIDE 100 MG/10ML IV SOLN
INTRAVENOUS | Status: DC | PRN
  Administered 2017-04-08: 50 mg via INTRAVENOUS

## 2017-04-08 MED ORDER — CEFAZOLIN SODIUM-DEXTROSE 2-4 GM/100ML-% IV SOLN
INTRAVENOUS | Status: AC
  Filled 2017-04-08: qty 100

## 2017-04-08 MED ORDER — MIDAZOLAM HCL 2 MG/2ML IJ SOLN
INTRAMUSCULAR | Status: AC
  Filled 2017-04-08: qty 2

## 2017-04-08 MED ORDER — GABAPENTIN 300 MG PO CAPS
ORAL_CAPSULE | ORAL | Status: AC
  Administered 2017-04-08: 300 mg via ORAL
  Filled 2017-04-08: qty 1

## 2017-04-08 MED ORDER — LIDOCAINE HCL (CARDIAC) 20 MG/ML IV SOLN
INTRAVENOUS | Status: DC | PRN
  Administered 2017-04-08: 60 mg via INTRATRACHEAL

## 2017-04-08 MED ORDER — LISINOPRIL 20 MG PO TABS
20.0000 mg | ORAL_TABLET | Freq: Once | ORAL | Status: AC
  Administered 2017-04-08: 20 mg via ORAL
  Filled 2017-04-08: qty 1

## 2017-04-08 MED ORDER — LIDOCAINE-EPINEPHRINE 1 %-1:100000 IJ SOLN
INTRAMUSCULAR | Status: DC | PRN
  Administered 2017-04-08: 10 mL

## 2017-04-08 MED ORDER — PROPOFOL 10 MG/ML IV BOLUS
INTRAVENOUS | Status: DC | PRN
  Administered 2017-04-08: 160 mg via INTRAVENOUS

## 2017-04-08 MED ORDER — FENTANYL CITRATE (PF) 250 MCG/5ML IJ SOLN
INTRAMUSCULAR | Status: DC | PRN
  Administered 2017-04-08 (×2): 50 ug via INTRAVENOUS

## 2017-04-08 MED ORDER — ONDANSETRON HCL 4 MG/2ML IJ SOLN
INTRAMUSCULAR | Status: DC | PRN
  Administered 2017-04-08: 4 mg via INTRAVENOUS

## 2017-04-08 MED ORDER — MEPERIDINE HCL 25 MG/ML IJ SOLN: 6.2500 mg | INTRAMUSCULAR | Status: DC | PRN

## 2017-04-08 MED ORDER — HYDROCODONE-ACETAMINOPHEN 5-325 MG PO TABS
ORAL_TABLET | ORAL | Status: AC
  Filled 2017-04-08: qty 1

## 2017-04-08 MED ORDER — HYDROCODONE-ACETAMINOPHEN 5-325 MG PO TABS
1.0000 | ORAL_TABLET | Freq: Once | ORAL | Status: AC
  Administered 2017-04-08: 1 via ORAL

## 2017-04-08 SURGICAL SUPPLY — 56 items
APL SKNCLS STERI-STRIP NONHPOA (GAUZE/BANDAGES/DRESSINGS) ×2
BENZOIN TINCTURE PRP APPL 2/3 (GAUZE/BANDAGES/DRESSINGS) ×4 IMPLANT
BLADE CLIPPER SURG (BLADE) ×2 IMPLANT
BLADE SURG 15 STRL LF DISP TIS (BLADE) ×2 IMPLANT
BLADE SURG 15 STRL SS (BLADE) ×4
CANISTER SUCT 3000ML PPV (MISCELLANEOUS) ×2 IMPLANT
CHLORAPREP W/TINT 26ML (MISCELLANEOUS) ×4 IMPLANT
CLOSURE WOUND 1/2 X4 (GAUZE/BANDAGES/DRESSINGS) ×1
COVER SURGICAL LIGHT HANDLE (MISCELLANEOUS) ×4 IMPLANT
DRAIN PENROSE 1/2X12 LTX STRL (WOUND CARE) ×2 IMPLANT
DRAPE LAPAROSCOPIC ABDOMINAL (DRAPES) ×2 IMPLANT
DRAPE UTILITY XL STRL (DRAPES) ×4 IMPLANT
DRSG TEGADERM 4X4.75 (GAUZE/BANDAGES/DRESSINGS) ×6 IMPLANT
ELECT CAUTERY BLADE 6.4 (BLADE) ×4 IMPLANT
ELECT REM PT RETURN 9FT ADLT (ELECTROSURGICAL) ×4
ELECTRODE REM PT RTRN 9FT ADLT (ELECTROSURGICAL) ×2 IMPLANT
GAUZE SPONGE 4X4 12PLY STRL (GAUZE/BANDAGES/DRESSINGS) ×4 IMPLANT
GAUZE SPONGE 4X4 16PLY XRAY LF (GAUZE/BANDAGES/DRESSINGS) ×4 IMPLANT
GLOVE BIO SURGEON STRL SZ7 (GLOVE) ×4 IMPLANT
GLOVE BIOGEL PI IND STRL 6.5 (GLOVE) IMPLANT
GLOVE BIOGEL PI IND STRL 7.0 (GLOVE) IMPLANT
GLOVE BIOGEL PI IND STRL 7.5 (GLOVE) ×2 IMPLANT
GLOVE BIOGEL PI INDICATOR 6.5 (GLOVE) ×4
GLOVE BIOGEL PI INDICATOR 7.0 (GLOVE) ×2
GLOVE BIOGEL PI INDICATOR 7.5 (GLOVE) ×2
GLOVE SURG SS PI 6.0 STRL IVOR (GLOVE) ×4 IMPLANT
GOWN STRL REUS W/ TWL LRG LVL3 (GOWN DISPOSABLE) ×4 IMPLANT
GOWN STRL REUS W/TWL LRG LVL3 (GOWN DISPOSABLE) ×12
KIT BASIN OR (CUSTOM PROCEDURE TRAY) ×4 IMPLANT
KIT ROOM TURNOVER OR (KITS) ×4 IMPLANT
MESH PARIETEX PROGRIP RIGHT (Mesh General) ×2 IMPLANT
MESH VENTRALEX ST 1-7/10 CRC S (Mesh General) ×2 IMPLANT
NDL HYPO 25GX1X1/2 BEV (NEEDLE) ×2 IMPLANT
NEEDLE HYPO 25GX1X1/2 BEV (NEEDLE) ×4 IMPLANT
NS IRRIG 1000ML POUR BTL (IV SOLUTION) ×4 IMPLANT
PACK SURGICAL SETUP 50X90 (CUSTOM PROCEDURE TRAY) ×4 IMPLANT
PAD ARMBOARD 7.5X6 YLW CONV (MISCELLANEOUS) ×4 IMPLANT
PENCIL BUTTON HOLSTER BLD 10FT (ELECTRODE) ×4 IMPLANT
SPONGE INTESTINAL PEANUT (DISPOSABLE) ×4 IMPLANT
STRIP CLOSURE SKIN 1/2X4 (GAUZE/BANDAGES/DRESSINGS) ×3 IMPLANT
SUT MNCRL AB 4-0 PS2 18 (SUTURE) ×6 IMPLANT
SUT NOVA NAB DX-16 0-1 5-0 T12 (SUTURE) ×4 IMPLANT
SUT NOVA NAB GS-21 0 18 T12 DT (SUTURE) ×2 IMPLANT
SUT VIC AB 0 CT2 27 (SUTURE) ×4 IMPLANT
SUT VIC AB 2-0 SH 27 (SUTURE) ×4
SUT VIC AB 2-0 SH 27X BRD (SUTURE) ×2 IMPLANT
SUT VIC AB 3-0 SH 27 (SUTURE) ×16
SUT VIC AB 3-0 SH 27X BRD (SUTURE) ×2 IMPLANT
SUT VIC AB 3-0 SH 27XBRD (SUTURE) ×2 IMPLANT
SYR BULB 3OZ (MISCELLANEOUS) ×4 IMPLANT
SYR CONTROL 10ML LL (SYRINGE) ×4 IMPLANT
TOWEL OR 17X24 6PK STRL BLUE (TOWEL DISPOSABLE) ×4 IMPLANT
TOWEL OR 17X26 10 PK STRL BLUE (TOWEL DISPOSABLE) ×4 IMPLANT
TUBE CONNECTING 12'X1/4 (SUCTIONS) ×1
TUBE CONNECTING 12X1/4 (SUCTIONS) ×1 IMPLANT
YANKAUER SUCT BULB TIP NO VENT (SUCTIONS) ×2 IMPLANT

## 2017-04-08 NOTE — Discharge Instructions (Signed)
[facility]  UMBILICAL OR INGUINAL HERNIA REPAIR: POST OP INSTRUCTIONS  Always review your discharge instruction sheet given to you by the facility where your surgery was performed. IF YOU HAVE DISABILITY OR FAMILY LEAVE FORMS, YOU MUST BRING THEM TO THE OFFICE FOR PROCESSING.   DO NOT GIVE THEM TO YOUR DOCTOR.  1. A  prescription for pain medication may be given to you upon discharge.  Take your pain medication as prescribed, if needed.  If narcotic pain medicine is not needed, then you may take acetaminophen (Tylenol) or ibuprofen (Advil) as needed. 2. Take your usually prescribed medications unless otherwise directed. 3. If you need a refill on your pain medication, please contact your pharmacy.  They will contact our office to request authorization. Prescriptions will not be filled after 5 pm or on week-ends. 4. You should follow a light diet the first 24 hours after arrival home, such as soup and crackers, etc.  Be sure to include lots of fluids daily.  Resume your normal diet the day after surgery. 5. Most patients will experience some swelling and bruising around the umbilicus or in the groin and scrotum.  Ice packs and reclining will help.  Swelling and bruising can take several days to resolve.  6. It is common to experience some constipation if taking pain medication after surgery.  Increasing fluid intake and taking a stool softener (such as Colace) will usually help or prevent this problem from occurring.  A mild laxative (Milk of Magnesia or Miralax) should be taken according to package directions if there are no bowel movements after 48 hours. 7. Unless discharge instructions indicate otherwise, you may remove your bandages 24-48 hours after surgery, and you may shower at that time.  You will have steri-strips (small skin tapes) in place directly over the incision.  These strips should be left on the skin for 7-10 days. 8. ACTIVITIES:  You may resume regular (light)  daily activities beginning the next day--such as daily self-care, walking, climbing stairs--gradually increasing activities as tolerated.  You may have sexual intercourse when it is comfortable.  Refrain from any heavy lifting or straining until approved by your doctor. a. You may drive when you are no longer taking prescription pain medication, you can comfortably wear a seatbelt, and you can safely maneuver your car and apply brakes. b. RETURN TO WORK:  2-3 weeks with light duty - no lifting over 15 lbs. 9. You should see your doctor in the office for a follow-up appointment approximately 2-3 weeks after your surgery.  Make sure that you call for this appointment within a day or two after you arrive home to insure a convenient appointment time. 10. OTHER INSTRUCTIONS:  __________________________________________________________________________________________________________________________________________________________________________________________  WHEN TO CALL YOUR DOCTOR: 1. Fever over 101.0 2. Inability to urinate 3. Nausea and/or vomiting 4. Extreme swelling or bruising 5. Continued bleeding from incision. 6. Increased pain, redness, or drainage from the incision  The clinic staff is available to answer your questions during regular business hours.  Please don't hesitate to call and ask to speak to one of the nurses for clinical concerns.  If you have a medical emergency, go to the nearest emergency room or call 911.  A surgeon from [facility] is always on call at the hospital   [address], [address], [place], [place]  [address] ?  [address] [address], [place], [place]   [address] [phone]    [phone]    FAX [phone] Web   site: [email or web address]    

## 2017-04-08 NOTE — Interval H&P Note (Signed)
History and Physical Interval Note:  04/08/2017 9:09 AM  Randy Walter  has presented today for surgery, with the diagnosis of RIGHT INGUINAL HERNIA / UMBILICAL HERNIA   The various methods of treatment have been discussed with the patient and family. After consideration of risks, benefits and other options for treatment, the patient has consented to  Procedure(s): RIGHT INGUINAL HERNIA REPAIR  WITH MESH (Right) UMBILICAL HERNIA REPAIR WITH MESH (N/A) INSERTION OF MESH (N/A) as a surgical intervention .  The patient's history has been reviewed, patient examined, no change in status, stable for surgery.  I have reviewed the patient's chart and labs.  Questions were answered to the patient's satisfaction.     Delora Gravatt K.

## 2017-04-08 NOTE — Anesthesia Postprocedure Evaluation (Signed)
Anesthesia Post Note  Patient: BROADY LAFOY  Procedure(s) Performed: RIGHT INGUINAL HERNIA REPAIR  WITH MESH (Right Inguinal) UMBILICAL HERNIA REPAIR WITH MESH (N/A Abdomen) INSERTION OF MESH (N/A Abdomen)     Patient location during evaluation: PACU Anesthesia Type: General Level of consciousness: awake and alert Pain management: pain level controlled Vital Signs Assessment: post-procedure vital signs reviewed and stable Respiratory status: spontaneous breathing, nonlabored ventilation, respiratory function stable and patient connected to nasal cannula oxygen Cardiovascular status: blood pressure returned to baseline and stable Postop Assessment: no apparent nausea or vomiting Anesthetic complications: no    Last Vitals:  Vitals:   04/08/17 1150 04/08/17 1151  BP:  (!) 196/99  Pulse:    Resp:    Temp: 36.6 C   SpO2:      Last Pain:  Vitals:   04/08/17 1150  TempSrc:   PainSc: 4                  Cornelious Diven EDWARD

## 2017-04-08 NOTE — H&P (View-Only) (Signed)
History of Present Illness Wilmon Arms(Cayla Wiegand K. Allora Bains MD; 03/10/2017 9:52 AM) The patient is a 71 year old male who presents with an inguinal hernia. Referred by Dr. Rinaldo CloudMohan Harwani for right inguinal hernia  This is a 71 year old male who is status post coronary stenting who presents with about a month history of a visible bulge in his right groin associated with some pain and burning. This area has become fairly uncomfortable. He remains reducible. He denies any obstructive symptoms. He brought this to the attention of Dr. Sharyn LullHarwani who diagnosed a right inguinal hernia. The patient is now referred for surgical evaluation. He is accompanied by a cardiac clearance note  The patient was hospitalized in April of this year to rule out MI. He is currently only on aspirin for blood thinner.      Past Surgical History (Tanisha A. Manson PasseyBrown, RMA; 03/10/2017 9:13 AM) No pertinent past surgical history  Diagnostic Studies History (Tanisha A. Manson PasseyBrown, RMA; 03/10/2017 9:13 AM) Colonoscopy never  Allergies (Tanisha A. Manson PasseyBrown, RMA; 03/10/2017 9:14 AM) No Known Drug Allergies 03/10/2017 Allergies Reconciled  Medication History (Tanisha A. Manson PasseyBrown, RMA; 03/10/2017 9:18 AM) AmLODIPine Besylate (5MG  Tablet, Oral) Active. Lisinopril (20MG  Tablet, Oral) Active. Ferrous Fumarate (325 (106 Fe)MG Tablet, Oral) Active. Nitroglycerin (0.4MG  Tab Sublingual, Sublingual) Active. Carvedilol (3.125MG  Tablet, Oral) Active. Aspirin (81MG  Tablet, Oral) Active. Atorvastatin Calcium (10MG  Tablet, Oral) Active. Medications Reconciled  Social History (Tanisha A. Manson PasseyBrown, RMA; 03/10/2017 9:13 AM) Alcohol use Recently quit alcohol use. Caffeine use Carbonated beverages, Coffee, Tea. No drug use Tobacco use Current some day smoker.  Family History (Tanisha A. Manson PasseyBrown, RMA; 03/10/2017 9:13 AM) Breast Cancer Daughter.  Other Problems (Tanisha A. Manson PasseyBrown, RMA; 03/10/2017 9:13 AM) Cerebrovascular Accident High blood  pressure     Review of Systems (Tanisha A. Brown RMA; 03/10/2017 9:13 AM) General Present- Fatigue. Not Present- Appetite Loss, Chills, Fever, Night Sweats, Weight Gain and Weight Loss. Skin Not Present- Change in Wart/Mole, Dryness, Hives, Jaundice, New Lesions, Non-Healing Wounds, Rash and Ulcer. HEENT Not Present- Earache, Hearing Loss, Hoarseness, Nose Bleed, Oral Ulcers, Ringing in the Ears, Seasonal Allergies, Sinus Pain, Sore Throat, Visual Disturbances, Wears glasses/contact lenses and Yellow Eyes. Breast Not Present- Breast Mass, Breast Pain, Nipple Discharge and Skin Changes. Cardiovascular Present- Shortness of Breath. Not Present- Chest Pain, Difficulty Breathing Lying Down, Leg Cramps, Palpitations, Rapid Heart Rate and Swelling of Extremities. Gastrointestinal Present- Abdominal Pain and Constipation. Not Present- Bloating, Bloody Stool, Change in Bowel Habits, Chronic diarrhea, Difficulty Swallowing, Excessive gas, Gets full quickly at meals, Hemorrhoids, Indigestion, Nausea, Rectal Pain and Vomiting. Male Genitourinary Present- Frequency. Not Present- Blood in Urine, Change in Urinary Stream, Impotence, Nocturia, Painful Urination, Urgency and Urine Leakage. Musculoskeletal Present- Muscle Weakness. Not Present- Back Pain, Joint Pain, Joint Stiffness, Muscle Pain and Swelling of Extremities. Neurological Present- Weakness. Not Present- Decreased Memory, Fainting, Headaches, Numbness, Seizures, Tingling, Tremor and Trouble walking. Endocrine Present- Heat Intolerance. Not Present- Cold Intolerance, Excessive Hunger, Hair Changes, Hot flashes and New Diabetes.  Vitals (Tanisha A. Brown RMA; 03/10/2017 9:14 AM) 03/10/2017 9:14 AM Weight: 180.6 lb Height: 73in Body Surface Area: 2.06 m Body Mass Index: 23.83 kg/m  Temp.: 97.54F  Pulse: 94 (Regular)  P.OX: 98% (Room air) BP: 138/82 (Sitting, Left Arm, Standard)      Physical Exam Molli Hazard(Konrad Hoak K. Catrell Morrone MD; 03/10/2017 9:53  AM)  The physical exam findings are as follows: Note:WDWN in NAD Eyes: Pupils equal, round; sclera anicteric HENT: Oral mucosa moist; good dentition Neck: No masses palpated, no  thyromegaly Lungs: CTA bilaterally; normal respiratory effort CV: Regular rate and rhythm; no murmurs; extremities well-perfused with no edema Abd: +bowel sounds, soft, non-tender, no palpable organomegaly; visible palpable umbilical hernia - reducible GU: bilateral descended testes; no testicular masses; large right inguinal bulge - reducible; tenderness radiates towards right testicle; no sign of left inguinal hernia Skin: Warm, dry; no sign of jaundice Psychiatric - alert and oriented x 4; calm mood and affect    Assessment & Plan Molli Hazard K. Geoge Lawrance MD; 03/10/2017 9:43 AM)  RIGHT INGUINAL HERNIA (K40.90)  UMBILICAL HERNIA (K42.9)  Current Plans Schedule for Surgery - Right inguinal hernia repair with mesh/ umbilical hernia repair with mesh. The surgical procedure has been discussed with the patient. Potential risks, benefits, alternative treatments, and expected outcomes have been explained. All of the patient's questions at this time have been answered. The likelihood of reaching the patient's treatment goal is good. The patient understand the proposed surgical procedure and wishes to proceed.  Wilmon Arms. Corliss Skains, MD, [facility]  General/ Trauma Surgery  03/10/2017 9:54 AM

## 2017-04-08 NOTE — Op Note (Signed)
Indications:  The patient presented with a history of an umbilical hernia and enlarging right inguinal hernia.  The patient was examined and we recommended umbilical hernia repair with mesh/ right inguinal hernia repair with mesh.  Pre-operative diagnosis:  Umbilical hernia/ Right inguinal hernia  Post-operative diagnosis:  Same  Procedure:  Umbilical hernia repair with mesh/ right inguinal hernia repair with mesh  Surgeon: Jenevie Casstevens K.   Assistants: none  Anesthesia: General LMA anesthesia and TAP black  ASA Class: 2   Procedure Details  The patient was seen again in the Holding Room. The risks, benefits, complications, treatment options, and expected outcomes were discussed with the patient. The possibilities of reaction to medication, pulmonary aspiration, perforation of viscus, bleeding, recurrent infection, the need for additional procedures, and development of a complication requiring transfusion or further operation were discussed with the patient and/or family. There was concurrence with the proposed plan, and informed consent was obtained. The sites of surgery were properly noted/marked. The patient was taken to the Operating Room, identified as Randy Walter, and the procedure verified as umbilical hernia repair with mesh and right inguinal hernia repair with mesh. A Time Out was held and the above information confirmed.  After an adequate level of general anesthesia was obtained, the patient's abdomen was prepped with Chloraprep and draped in sterile fashion.  We made a transverse incision below the umbilicus.  Dissection was carried down to the hernia sac with cautery.  We dissected bluntly around the hernia sac down to the edge of the fascial defect.  We reduced the hernia sac back into the pre-peritoneal space.  The fascial defect measured 2 cm.  We cleared the fascia in all directions.  A small Ventralex mesh was inserted into the pre-peritoneal space and was deployed.  The  mesh was secured with four trans-fascial sutures of 0 Novofil.  The fascial defect was closed with multiple interrupted figure-of-eight 1 Novofil sutures.  The base of the umbilicus was tacked down with 3-0 Vicryl.  3-0 Vicryl was used to close the subcutaneous tissues and 4-0 Monocryl was used to close the skin.    We then turned our attention to the right groin.  0.25% Marcaine with epinephrine was used to anesthetize the skin over the mid-portion of the inguinal canal. An oblique incision was made. Dissection was carried down through the subcutaneous tissue with cautery to the external oblique fascia.  We opened the external oblique fascia along the direction of its fibers to the external ring.  The spermatic cord was circumferentially dissected bluntly and retracted with a Penrose drain.  The ilioinguinal nerve was identified and preserved.  The floor of the inguinal canal was inspected and was intact.  We skeletonized the spermatic cord and reduced a moderate-sized indirect hernia sac.  The internal ring was tightened with 0 Vicryl.  We used a right Progrip mesh which was inserted and deployed across the floor of the inguinal canal. The mesh was tucked underneath the external oblique fascia laterally.  The flap of the mesh was closed around the spermatic cord to recreate the internal inguinal ring.  The mesh was secured to the pubic tubercle with 0 Vicryl.  Additional sutures were used to secure the lower edge of the mesh to the shelving edge.  The external oblique fascia was reapproximated with 2-0 Vicryl.  3-0 Vicryl was used to close the subcutaneous tissues and 4-0 Monocryl was used to close the skin in subcuticular fashion.  Benzoin and steri-strips were used to  seal the incisions.  Clean dressings was applied.  The patient was then extubated and brought to the recovery room in stable condition.  All sponge, instrument, and needle counts were correct prior to closure and at the conclusion of the  case.   Estimated Blood Loss: Minimal                 Complications: None; patient tolerated the procedure well.         Disposition: PACU - hemodynamically stable.         Condition: stable  Wilmon Arms. Corliss Skains, MD, [facility]  General/ Trauma Surgery  04/08/2017 10:47 AM

## 2017-04-08 NOTE — Anesthesia Procedure Notes (Signed)
Anesthesia Regional Block: TAP block   Pre-Anesthetic Checklist: ,, timeout performed, Correct Patient, Correct Site, Correct Laterality, Correct Procedure, Correct Position, site marked, Risks and benefits discussed, pre-op evaluation,  At surgeon's request and post-op pain management  Laterality: Right  Prep: chloraprep       Needles:   Needle Type: Echogenic Needle     Needle Length: 9cm  Needle Gauge: 21     Additional Needles:   Procedures:,,,, ultrasound used (permanent image in chart),,,,  Narrative:  Start time: 04/08/2017 9:23 AM End time: 04/08/2017 9:25 AM Injection made incrementally with aspirations every 5 mL. Anesthesiologist: Cristela Blue

## 2017-04-08 NOTE — Anesthesia Preprocedure Evaluation (Addendum)
Anesthesia Evaluation  Patient identified by MRN, date of birth, ID band Patient awake    Reviewed: Allergy & Precautions, H&P , Patient's Chart, lab work & pertinent test results, reviewed documented beta blocker date and time   Airway Mallampati: II  TM Distance: >3 FB Neck ROM: full    Dental no notable dental hx. (+) Poor Dentition, Loose, Dental Advisory Given, Missing   Pulmonary COPD, Current Smoker,    Pulmonary exam normal breath sounds clear to auscultation       Cardiovascular hypertension, +CHF   Rhythm:regular Rate:Normal     Neuro/Psych    GI/Hepatic   Endo/Other    Renal/GU      Musculoskeletal   Abdominal   Peds  Hematology   Anesthesia Other Findings   Reproductive/Obstetrics                            Anesthesia Physical Anesthesia Plan  ASA: III  Anesthesia Plan: General   Post-op Pain Management:  Regional for Post-op pain   Induction: Intravenous  PONV Risk Score and Plan: 2 and Ondansetron, Dexamethasone and Treatment may vary due to age or medical condition  Airway Management Planned: Oral ETT  Additional Equipment:   Intra-op Plan:   Post-operative Plan: Extubation in OR  Informed Consent: I have reviewed the patients History and Physical, chart, labs and discussed the procedure including the risks, benefits and alternatives for the proposed anesthesia with the patient or authorized representative who has indicated his/her understanding and acceptance.   Dental Advisory Given  Plan Discussed with: CRNA, Surgeon and Anesthesiologist  Anesthesia Plan Comments: (  )       Anesthesia Quick Evaluation

## 2017-04-08 NOTE — Anesthesia Procedure Notes (Signed)
Procedure Name: Intubation Date/Time: 04/08/2017 9:22 AM Performed by: Julieta Bellini Pre-anesthesia Checklist: Patient identified, Emergency Drugs available, Suction available and Patient being monitored Patient Re-evaluated:Patient Re-evaluated prior to induction Oxygen Delivery Method: Circle system utilized Preoxygenation: Pre-oxygenation with 100% oxygen Induction Type: IV induction Ventilation: Mask ventilation without difficulty Laryngoscope Size: Mac and 4 Grade View: Grade II Tube type: Oral Tube size: 7.5 mm Number of attempts: 1 Airway Equipment and Method: Stylet Placement Confirmation: ETT inserted through vocal cords under direct vision,  positive ETCO2 and breath sounds checked- equal and bilateral Secured at: 23 cm Tube secured with: Tape Dental Injury: Teeth and Oropharynx as per pre-operative assessment

## 2017-04-08 NOTE — Transfer of Care (Addendum)
Immediate Anesthesia Transfer of Care Note  Patient: Randy Walter  Procedure(s) Performed: RIGHT INGUINAL HERNIA REPAIR  WITH MESH (Right Inguinal) UMBILICAL HERNIA REPAIR WITH MESH (N/A Abdomen) INSERTION OF MESH (N/A Abdomen)  Patient Location: PACU  Anesthesia Type:General  Level of Consciousness: drowsy and patient cooperative  Airway & Oxygen Therapy: Patient Spontanous Breathing and Patient connected to nasal cannula oxygen  Post-op Assessment: Report given to RN, Post -op Vital signs reviewed and stable and Patient moving all extremities X 4  Post vital signs: Reviewed and stable  Last Vitals:  Vitals:   04/08/17 0728 04/08/17 1051  BP: (!) 190/92   Pulse:    Resp:    Temp:  (!) (P) 36.3 C  SpO2:      Last Pain:  Vitals:   04/08/17 1051  TempSrc:   PainSc: (P) Asleep      Patients Stated Pain Goal: 3 (04/08/17 0734)  Complications: No apparent anesthesia complications

## 2017-04-08 NOTE — Progress Notes (Signed)
Pt blood pressure elevated upon arrival...lowest reading was 190/92, highest 213/94.  Dr. Jean Rosenthal has been informed and is aware of elevated blood pressures.

## 2017-04-09 ENCOUNTER — Encounter (HOSPITAL_COMMUNITY): Payer: Self-pay | Admitting: Surgery

## 2018-05-02 ENCOUNTER — Emergency Department (HOSPITAL_COMMUNITY): Payer: Medicare Other

## 2018-05-02 ENCOUNTER — Emergency Department (HOSPITAL_COMMUNITY)
Admission: EM | Admit: 2018-05-02 | Discharge: 2018-05-02 | Disposition: A | Discharge status: Home / Self Care | Payer: Medicare Other | Attending: Emergency Medicine | Admitting: Emergency Medicine

## 2018-05-02 ENCOUNTER — Encounter (HOSPITAL_COMMUNITY): Payer: Self-pay | Admitting: Emergency Medicine

## 2018-05-02 DIAGNOSIS — Z8673 Personal history of transient ischemic attack (TIA), and cerebral infarction without residual deficits: Secondary | ICD-10-CM | POA: Diagnosis not present

## 2018-05-02 DIAGNOSIS — F17210 Nicotine dependence, cigarettes, uncomplicated: Secondary | ICD-10-CM | POA: Diagnosis not present

## 2018-05-02 DIAGNOSIS — J449 Chronic obstructive pulmonary disease, unspecified: Secondary | ICD-10-CM | POA: Insufficient documentation

## 2018-05-02 DIAGNOSIS — I110 Hypertensive heart disease with heart failure: Secondary | ICD-10-CM | POA: Insufficient documentation

## 2018-05-02 DIAGNOSIS — Z7982 Long term (current) use of aspirin: Secondary | ICD-10-CM | POA: Diagnosis not present

## 2018-05-02 DIAGNOSIS — R079 Chest pain, unspecified: Secondary | ICD-10-CM | POA: Diagnosis present

## 2018-05-02 DIAGNOSIS — I2510 Atherosclerotic heart disease of native coronary artery without angina pectoris: Secondary | ICD-10-CM | POA: Insufficient documentation

## 2018-05-02 DIAGNOSIS — I5042 Chronic combined systolic (congestive) and diastolic (congestive) heart failure: Secondary | ICD-10-CM | POA: Diagnosis not present

## 2018-05-02 DIAGNOSIS — M5414 Radiculopathy, thoracic region: Secondary | ICD-10-CM | POA: Diagnosis not present

## 2018-05-02 DIAGNOSIS — Z79899 Other long term (current) drug therapy: Secondary | ICD-10-CM | POA: Diagnosis not present

## 2018-05-02 DIAGNOSIS — E7800 Pure hypercholesterolemia, unspecified: Secondary | ICD-10-CM | POA: Insufficient documentation

## 2018-05-02 DIAGNOSIS — M5410 Radiculopathy, site unspecified: Secondary | ICD-10-CM

## 2018-05-02 LAB — CBC
HCT: 39.3 % (ref 39.0–52.0)
Hemoglobin: 11.7 g/dL — ABNORMAL LOW (ref 13.0–17.0)
MCH: 27.5 pg (ref 26.0–34.0)
MCHC: 29.8 g/dL — ABNORMAL LOW (ref 30.0–36.0)
MCV: 92.5 fL (ref 80.0–100.0)
Platelets: 225 10*3/uL (ref 150–400)
RBC: 4.25 MIL/uL (ref 4.22–5.81)
RDW: 14.2 % (ref 11.5–15.5)
WBC: 4.3 10*3/uL (ref 4.0–10.5)
nRBC: 0 % (ref 0.0–0.2)

## 2018-05-02 LAB — BASIC METABOLIC PANEL
Anion gap: 3 — ABNORMAL LOW (ref 5–15)
BUN: 13 mg/dL (ref 8–23)
CO2: 27 mmol/L (ref 22–32)
Calcium: 9.3 mg/dL (ref 8.9–10.3)
Chloride: 108 mmol/L (ref 98–111)
Creatinine, Ser: 1.21 mg/dL (ref 0.61–1.24)
GFR calc Af Amer: >60 mL/min (ref >60)
GFR calc non Af Amer: 58 mL/min — ABNORMAL LOW (ref >60)
Glucose, Bld: 86 mg/dL (ref 70–99)
Potassium: 4.5 mmol/L (ref 3.5–5.1)
Sodium: 138 mmol/L (ref 135–145)

## 2018-05-02 LAB — I-STAT TROPONIN, ED: Troponin i, poc: 0 ng/mL (ref 0.00–0.08)

## 2018-05-02 MED ORDER — DEXAMETHASONE 4 MG PO TABS
12.0000 mg | ORAL_TABLET | Freq: Once | ORAL | Status: AC
Start: 2018-05-02 — End: 2018-05-02
  Administered 2018-05-02: 12 mg via ORAL
  Filled 2018-05-02: qty 3

## 2018-05-02 MED ORDER — TRAMADOL HCL 50 MG PO TABS: 50.0000 mg | ORAL_TABLET | Freq: Four times a day (QID) | ORAL | 0 refills | Status: DC | PRN

## 2018-05-02 MED ORDER — GABAPENTIN 300 MG PO CAPS
300.0000 mg | ORAL_CAPSULE | Freq: Once | ORAL | Status: AC
  Administered 2018-05-02: 300 mg via ORAL
  Filled 2018-05-02: qty 1

## 2018-05-02 MED ORDER — POLYETHYLENE GLYCOL 3350 17 G PO PACK: 17.0000 g | PACK | Freq: Two times a day (BID) | ORAL | 0 refills | Status: DC | PRN

## 2018-05-02 MED ORDER — KETOROLAC TROMETHAMINE 15 MG/ML IJ SOLN
15.0000 mg | Freq: Once | INTRAMUSCULAR | Status: AC
  Administered 2018-05-02: 15 mg via INTRAMUSCULAR
  Filled 2018-05-02: qty 1

## 2018-05-02 MED ORDER — GABAPENTIN 300 MG PO CAPS: 300.0000 mg | ORAL_CAPSULE | Freq: Three times a day (TID) | ORAL | 0 refills | Status: DC

## 2018-05-02 NOTE — ED Notes (Signed)
Pt attempted to sign discharge but was unable to due to signature pad not working

## 2018-05-02 NOTE — ED Triage Notes (Signed)
Pt presents to ED for assessment of left sided chest pain, intermittent, shooting, since yesterday.  Patient states pain goes from the right side of his neck, into his left chest, then shoots through his back, and down his right leg.  States the pain is reproduceable every time he tries to stand up.  Denies pain at this time.  C/o worsening SOB x 3 days.  Denies n/v.  C/o intermittent headache.

## 2018-05-02 NOTE — ED Notes (Signed)
Results reviewed, no changes in acuity at this time 

## 2018-05-02 NOTE — ED Provider Notes (Signed)
[facility] EMERGENCY DEPARTMENT Provider Note   CSN: [id number] Arrival date & time: 05/02/18  1339     History   Chief Complaint Chief Complaint  Patient presents with  . Chest Pain    HPI Randy Walter is a 72 y.o. male.  HPI   72 year old male with radicular pain which began yesterday.  Asymptomatic at rest.  He has sharp shooting pain when he goes to stand up.  Improves when he is completely upright and still but then aggravated again when walking.  Describes pain basically in his right chest right flank right lower back and into his right thigh.  No weakness.  Denies any acute trauma.  Past Medical History:  Diagnosis Date  . Arthritis    "it moves; knees, shoulders" (10/17/2016)  . CAD (coronary artery disease)   . CHF (congestive heart failure) (HCC)    Hattie Perch 10/17/2016  . COPD (chronic obstructive pulmonary disease) (HCC)    Hattie Perch 10/17/2016  . Dyspnea    w/ exertion   . High cholesterol   . Hypertension   . Stroke (HCC)    Hattie Perch 10/17/2016  . Tobacco use     Patient Active Problem List   Diagnosis Date Noted  . Acute coronary syndrome (HCC) 10/17/2016  . Near syncope 02/05/2015  . AKI (acute kidney injury) (HCC) 02/05/2015  . Chronic combined systolic and diastolic CHF (congestive heart failure) (HCC) 12/05/2013  . COPD (chronic obstructive pulmonary disease) (HCC) 12/03/2013  . SOB (shortness of breath) 12/03/2013  . Acute bronchitis 12/03/2013  . Uncontrolled hypertension 12/03/2013  . CAD (coronary artery disease) 12/03/2013    Past Surgical History:  Procedure Laterality Date  . CORONARY ANGIOPLASTY WITH STENT PLACEMENT  2007   to mid LAD /notes 10/17/2016  . INGUINAL HERNIA REPAIR Right 04/08/2017   Procedure: RIGHT INGUINAL HERNIA REPAIR  WITH MESH;  Surgeon: Manus Rudd, MD;  Location: [facility] OR;  Service: General;  Laterality: Right;  . INSERTION OF MESH N/A 04/08/2017   Procedure: INSERTION OF MESH;  Surgeon: Manus Rudd, MD;  Location: [facility] OR;  Service: General;  Laterality: N/A;  . LEFT HEART CATH AND CORONARY ANGIOGRAPHY N/A 10/18/2016   Procedure: Left Heart Cath and Coronary Angiography;  Surgeon: Rinaldo Cloud, MD;  Location: [facility] INVASIVE CV LAB;  Service: Cardiovascular;  Laterality: N/A;  . UMBILICAL HERNIA REPAIR N/A 04/08/2017   Procedure: UMBILICAL HERNIA REPAIR WITH MESH;  Surgeon: Manus Rudd, MD;  Location: [facility] OR;  Service: General;  Laterality: N/A;        Home Medications    Prior to Admission medications   Medication Sig Start Date End Date Taking? Authorizing Provider  aspirin EC 81 MG tablet Take 1 tablet (81 mg total) by mouth daily. 12/05/13   Philip Aspen, Limmie Patricia, MD  atorvastatin (LIPITOR) 10 MG tablet Take 1 tablet (10 mg total) by mouth daily. 12/05/13   Philip Aspen, Limmie Patricia, MD  carvedilol (COREG) 6.25 MG tablet Take 6.25 mg by mouth 2 (two) times daily with a meal.    [provider]  HYDROcodone-acetaminophen (NORCO/VICODIN) 5-325 MG tablet Take 1 tablet by mouth every 6 (six) hours as needed for moderate pain. 04/08/17   Manus Rudd, MD  lisinopril (PRINIVIL,ZESTRIL) 20 MG tablet Take 40 mg by mouth 2 (two) times daily.     [provider]  nitroGLYCERIN (NITROSTAT) 0.4 MG SL tablet Place 1 tablet (0.4 mg total) under the tongue every 5 (five) minutes x 3 doses  as needed for chest pain. 10/18/16   Rinaldo Cloud, MD    Family History Family History  Problem Relation Age of Onset  . CAD Maternal Grandmother     Social History Social History   Tobacco Use  . Smoking status: Current Every Day Smoker    Packs/day: 0.50    Years: 60.00    Pack years: 30.00    Types: Cigarettes  . Smokeless tobacco: Former Neurosurgeon    Types: Snuff, Chew  . Tobacco comment: 10/17/2016 "no chew or snuff for 50 or 60 years"  Substance Use Topics  . Alcohol use: Yes    Comment: 10/17/2016 "nothing in 4-5 years; did drink 1 pint to 1 1/2 pints qd except Saturday;  would drink 1/5 on Saturdays; not as much on Sundays"  . Drug use: No     Allergies   Patient has no known allergies.   Review of Systems Review of Systems  All systems reviewed and negative, other than as noted in HPI.  Physical Exam Updated Vital Signs BP (!) 196/92   Pulse 64   Temp 98.1 F (36.7 C) (Oral)   Resp 18   SpO2 100%   Physical Exam  Constitutional: He appears well-developed and well-nourished. No distress.  HENT:  Head: Normocephalic and atraumatic.  Eyes: Conjunctivae are normal. Right eye exhibits no discharge. Left eye exhibits no discharge.  Neck: Neck supple.  Cardiovascular: Normal rate, regular rhythm and normal heart sounds. Exam reveals no gallop and no friction rub.  No murmur heard. Pulmonary/Chest: Effort normal and breath sounds normal. No respiratory distress.  Abdominal: Soft. He exhibits no distension. There is no tenderness.  Musculoskeletal: He exhibits no edema or tenderness.  No midline spinal tenderness.  Strength is 5 out of 5 bilateral upper and lower extremities.  Sensation is intact to light touch.  Neurological: He is alert.  Skin: Skin is warm and dry.  Psychiatric: He has a normal mood and affect. His behavior is normal. Thought content normal.  Nursing note and vitals reviewed.    ED Treatments / Results  Labs (all labs ordered are listed, but only abnormal results are displayed) Labs Reviewed  BASIC METABOLIC PANEL - Abnormal; Notable for the following components:      Result Value   GFR calc non Af Amer 58 (*)    Anion gap 3 (*)    All other components within normal limits  CBC - Abnormal; Notable for the following components:   Hemoglobin 11.7 (*)    MCHC 29.8 (*)    All other components within normal limits  I-STAT TROPONIN, ED    EKG EKG Interpretation  Date/Time:  Saturday May 02 2018 13:44:15 EDT Ventricular Rate:  67 PR Interval:  226 QRS Duration: 94 QT Interval:  406 QTC Calculation: 429 R  Axis:   42 Text Interpretation:  Sinus rhythm with 1st degree A-V block Left ventricular hypertrophy Confirmed by Raeford Razor [id number]) on 05/02/2018 4:22:46 PM   Radiology Dg Chest 2 View  Result Date: 05/02/2018 CLINICAL DATA:  Left-sided chest pain and shortness of breath. EXAM: CHEST - 2 VIEW COMPARISON:  Two-view chest x-ray 10/17/2016 FINDINGS: Heart size. Atherosclerotic calcifications are present at the aortic arch. There is no edema or effusion. No focal airspace disease is present. The lungs are chronically hyperinflated. Visualized soft tissues and bony thorax are unremarkable. IMPRESSION: 1. No acute cardiopulmonary disease. 2. Stable changes of COPD. 3. Aortic atherosclerosis Electronically Signed   By: Virl Son.D.  On: 05/02/2018 15:08    Procedures Procedures (including critical care time)  Medications Ordered in ED Medications - No data to display   Initial Impression / Assessment and Plan / ED Course  I have reviewed the triage vital signs and the nursing notes.  Pertinent labs & imaging results that were available during my care of the patient were reviewed by me and considered in my medical decision making (see chart for details).       Final Clinical Impressions(s) / ED Diagnoses   Final diagnoses:  Radicular pain    ED Discharge Orders    None       Raeford Razor, MD 05/07/18 1027

## 2019-01-27 ENCOUNTER — Other Ambulatory Visit: Payer: Self-pay

## 2019-01-27 ENCOUNTER — Emergency Department (HOSPITAL_COMMUNITY)
Admission: EM | Admit: 2019-01-27 | Discharge: 2019-01-27 | Disposition: A | Discharge status: Home / Self Care | Payer: Medicare Other | Attending: Emergency Medicine | Admitting: Emergency Medicine

## 2019-01-27 ENCOUNTER — Encounter (HOSPITAL_COMMUNITY): Payer: Self-pay | Admitting: Radiology

## 2019-01-27 ENCOUNTER — Emergency Department (HOSPITAL_COMMUNITY): Payer: Medicare Other

## 2019-01-27 DIAGNOSIS — I5042 Chronic combined systolic (congestive) and diastolic (congestive) heart failure: Secondary | ICD-10-CM | POA: Insufficient documentation

## 2019-01-27 DIAGNOSIS — R4781 Slurred speech: Secondary | ICD-10-CM | POA: Diagnosis present

## 2019-01-27 DIAGNOSIS — R4701 Aphasia: Secondary | ICD-10-CM | POA: Diagnosis not present

## 2019-01-27 DIAGNOSIS — I2510 Atherosclerotic heart disease of native coronary artery without angina pectoris: Secondary | ICD-10-CM | POA: Insufficient documentation

## 2019-01-27 DIAGNOSIS — Z79899 Other long term (current) drug therapy: Secondary | ICD-10-CM | POA: Insufficient documentation

## 2019-01-27 DIAGNOSIS — Z8673 Personal history of transient ischemic attack (TIA), and cerebral infarction without residual deficits: Secondary | ICD-10-CM | POA: Insufficient documentation

## 2019-01-27 DIAGNOSIS — Z955 Presence of coronary angioplasty implant and graft: Secondary | ICD-10-CM | POA: Diagnosis not present

## 2019-01-27 DIAGNOSIS — F17210 Nicotine dependence, cigarettes, uncomplicated: Secondary | ICD-10-CM | POA: Insufficient documentation

## 2019-01-27 DIAGNOSIS — J449 Chronic obstructive pulmonary disease, unspecified: Secondary | ICD-10-CM | POA: Diagnosis not present

## 2019-01-27 DIAGNOSIS — I110 Hypertensive heart disease with heart failure: Secondary | ICD-10-CM | POA: Diagnosis not present

## 2019-01-27 DIAGNOSIS — Z7982 Long term (current) use of aspirin: Secondary | ICD-10-CM | POA: Diagnosis not present

## 2019-01-27 DIAGNOSIS — Z03818 Encounter for observation for suspected exposure to other biological agents ruled out: Secondary | ICD-10-CM | POA: Insufficient documentation

## 2019-01-27 LAB — I-STAT BETA HCG BLOOD, ED (MC, WL, AP ONLY): I-stat hCG, quantitative: <5 m[IU]/mL (ref <5)

## 2019-01-27 LAB — CBC
HCT: 37.7 % — ABNORMAL LOW (ref 39.0–52.0)
Hemoglobin: 11.6 g/dL — ABNORMAL LOW (ref 13.0–17.0)
MCH: 28.4 pg (ref 26.0–34.0)
MCHC: 30.8 g/dL (ref 30.0–36.0)
MCV: 92.2 fL (ref 80.0–100.0)
Platelets: 215 10*3/uL (ref 150–400)
RBC: 4.09 MIL/uL — ABNORMAL LOW (ref 4.22–5.81)
RDW: 15 % (ref 11.5–15.5)
WBC: 4 10*3/uL (ref 4.0–10.5)
nRBC: 0 % (ref 0.0–0.2)

## 2019-01-27 LAB — DIFFERENTIAL
Abs Immature Granulocytes: 0.01 10*3/uL (ref 0.00–0.07)
Basophils Absolute: 0 10*3/uL (ref 0.0–0.1)
Basophils Relative: 1 %
Eosinophils Absolute: 0.1 10*3/uL (ref 0.0–0.5)
Eosinophils Relative: 3 %
Immature Granulocytes: 0 %
Lymphocytes Relative: 34 %
Lymphs Abs: 1.4 10*3/uL (ref 0.7–4.0)
Monocytes Absolute: 0.2 10*3/uL (ref 0.1–1.0)
Monocytes Relative: 6 %
Neutro Abs: 2.3 10*3/uL (ref 1.7–7.7)
Neutrophils Relative %: 56 %

## 2019-01-27 LAB — APTT: aPTT: 32 seconds (ref 24–36)

## 2019-01-27 LAB — COMPREHENSIVE METABOLIC PANEL
ALT: 19 U/L (ref 0–44)
AST: 17 U/L (ref 15–41)
Albumin: 3.4 g/dL — ABNORMAL LOW (ref 3.5–5.0)
Alkaline Phosphatase: 69 U/L (ref 38–126)
Anion gap: 8 (ref 5–15)
BUN: 19 mg/dL (ref 8–23)
CO2: 25 mmol/L (ref 22–32)
Calcium: 9.3 mg/dL (ref 8.9–10.3)
Chloride: 110 mmol/L (ref 98–111)
Creatinine, Ser: 1.18 mg/dL (ref 0.61–1.24)
GFR calc Af Amer: >60 mL/min (ref >60)
GFR calc non Af Amer: >60 mL/min (ref >60)
Glucose, Bld: 146 mg/dL — ABNORMAL HIGH (ref 70–99)
Potassium: 3.8 mmol/L (ref 3.5–5.1)
Sodium: 143 mmol/L (ref 135–145)
Total Bilirubin: 0.7 mg/dL (ref 0.3–1.2)
Total Protein: 6.2 g/dL — ABNORMAL LOW (ref 6.5–8.1)

## 2019-01-27 LAB — SARS CORONAVIRUS 2 BY RT PCR (HOSPITAL ORDER, PERFORMED IN ~~LOC~~ HOSPITAL LAB): SARS Coronavirus 2: NEGATIVE

## 2019-01-27 LAB — PROTIME-INR
INR: 1 (ref 0.8–1.2)
Prothrombin Time: 13.4 seconds (ref 11.4–15.2)

## 2019-01-27 LAB — CBG MONITORING, ED: Glucose-Capillary: 84 mg/dL (ref 70–99)

## 2019-01-27 MED ORDER — IOHEXOL 350 MG/ML SOLN
75.0000 mL | Freq: Once | INTRAVENOUS | Status: AC | PRN
  Administered 2019-01-27: 75 mL via INTRAVENOUS

## 2019-01-27 MED ORDER — SODIUM CHLORIDE 0.9% FLUSH
3.0000 mL | Freq: Once | INTRAVENOUS | Status: AC
  Administered 2019-01-27: 3 mL via INTRAVENOUS

## 2019-01-27 NOTE — ED Notes (Signed)
Pt CBG was 84, notified Ryan(RN)

## 2019-01-27 NOTE — ED Provider Notes (Signed)
MSE was initiated and I personally evaluated the patient and placed orders (if any) at  2:46 PM on January 27, 2019.  Brief history - sudden onset slurred speech, confusion ~1pm, symptoms lasting ~5 minutes and resolved spontaneously.  Brief exam - well appearing, no distress, AOx3, speech clear, CN2-12 intact, normal FNF, 5/5 strength in BUE, BLE, sensation intact in all four extremities  The patient appears stable so that the remainder of the MSE may be completed by another provider.  Lucrezia Starch 2:54 PM 01/27/19     Lucrezia Starch, MD 01/27/19 [phone]

## 2019-01-27 NOTE — ED Triage Notes (Signed)
Patient BIB ems due to patient having slurred speech and weakness to his left side after going to a graveside funeral this morning. Patient sx started around 1300, ems called at 1305. Sx resolved before EMS arrived and patient was back to baseline with ems.

## 2019-01-27 NOTE — ED Provider Notes (Signed)
[facility] EMERGENCY DEPARTMENT Provider Note   CSN: [id number] Arrival date & time: 01/27/19  1348    History   Chief Complaint Chief Complaint  Patient presents with   Weakness   Aphasia    HPI Randy Walter is a 73 y.o. male with a past medical history of hypertension, hyperlipidemia, tobacco abuse, CAD, CHF, prior TIA in 2018 who presents to ED for sudden onset of slurred speech and confusion around 1300.  Symptoms lasted for about 5 minutes.  These occurred while he was attending a funeral.  Wife at bedside states that he seems like he was "stumbling while walking" as well.  States that he is currently at his baseline with no complaints of headache, vision changes, vomiting, numbness in arms or legs, injuries or falls.  States that this occurred similarly in 2018 when he had a "small stroke."  Reports compliance with his home medications.     HPI  Past Medical History:  Diagnosis Date   Arthritis    "it moves; knees, shoulders" (10/17/2016)   CAD (coronary artery disease)    CHF (congestive heart failure) (HCC)    Hattie Perch 10/17/2016   COPD (chronic obstructive pulmonary disease) (HCC)    Hattie Perch 10/17/2016   Dyspnea    w/ exertion    High cholesterol    Hypertension    Stroke (HCC)    Hattie Perch 10/17/2016   Tobacco use     Patient Active Problem List   Diagnosis Date Noted   Acute coronary syndrome (HCC) 10/17/2016   Near syncope 02/05/2015   AKI (acute kidney injury) (HCC) 02/05/2015   Chronic combined systolic and diastolic CHF (congestive heart failure) (HCC) 12/05/2013   COPD (chronic obstructive pulmonary disease) (HCC) 12/03/2013   SOB (shortness of breath) 12/03/2013   Acute bronchitis 12/03/2013   Uncontrolled hypertension 12/03/2013   CAD (coronary artery disease) 12/03/2013    Past Surgical History:  Procedure Laterality Date   CORONARY ANGIOPLASTY WITH STENT PLACEMENT  2007   to mid LAD /notes 10/17/2016    INGUINAL HERNIA REPAIR Right 04/08/2017   Procedure: RIGHT INGUINAL HERNIA REPAIR  WITH MESH;  Surgeon: Manus Rudd, MD;  Location: [facility] OR;  Service: General;  Laterality: Right;   INSERTION OF MESH N/A 04/08/2017   Procedure: INSERTION OF MESH;  Surgeon: Manus Rudd, MD;  Location: [facility] OR;  Service: General;  Laterality: N/A;   LEFT HEART CATH AND CORONARY ANGIOGRAPHY N/A 10/18/2016   Procedure: Left Heart Cath and Coronary Angiography;  Surgeon: Rinaldo Cloud, MD;  Location: [facility] INVASIVE CV LAB;  Service: Cardiovascular;  Laterality: N/A;   UMBILICAL HERNIA REPAIR N/A 04/08/2017   Procedure: UMBILICAL HERNIA REPAIR WITH MESH;  Surgeon: Manus Rudd, MD;  Location: [facility] OR;  Service: General;  Laterality: N/A;        Home Medications    Prior to Admission medications   Medication Sig Start Date End Date Taking? Authorizing Provider  aspirin EC 81 MG tablet Take 1 tablet (81 mg total) by mouth daily. 12/05/13   Philip Aspen, Limmie Patricia, MD  atorvastatin (LIPITOR) 10 MG tablet Take 1 tablet (10 mg total) by mouth daily. 12/05/13   Philip Aspen, Limmie Patricia, MD  carvedilol (COREG) 6.25 MG tablet Take 6.25 mg by mouth 2 (two) times daily with a meal.    [provider]  gabapentin (NEURONTIN) 300 MG capsule Take 1 capsule (300 mg total) by mouth 3 (three) times daily. 05/02/18   Raeford Razor, MD  HYDROcodone-acetaminophen (  NORCO/VICODIN) 5-325 MG tablet Take 1 tablet by mouth every 6 (six) hours as needed for moderate pain. 04/08/17   Manus Ruddsuei, Matthew, MD  lisinopril (PRINIVIL,ZESTRIL) 20 MG tablet Take 40 mg by mouth 2 (two) times daily.     [provider]  nitroGLYCERIN (NITROSTAT) 0.4 MG SL tablet Place 1 tablet (0.4 mg total) under the tongue every 5 (five) minutes x 3 doses as needed for chest pain. 10/18/16   Rinaldo CloudHarwani, Mohan, MD  polyethylene glycol (MIRALAX / GLYCOLAX) packet Take 17 g by mouth 2 (two) times daily as needed for mild constipation or moderate constipation.  05/02/18   Raeford RazorKohut, Stephen, MD  traMADol (ULTRAM) 50 MG tablet Take 1 tablet (50 mg total) by mouth every 6 (six) hours as needed. 05/02/18   Raeford RazorKohut, Stephen, MD    Family History Family History  Problem Relation Age of Onset   CAD Maternal Grandmother     Social History Social History   Tobacco Use   Smoking status: Current Every Day Smoker    Packs/day: 0.50    Years: 60.00    Pack years: 30.00    Types: Cigarettes   Smokeless tobacco: Former NeurosurgeonUser    Types: Snuff, Chew   Tobacco comment: 10/17/2016 "no chew or snuff for 50 or 60 years"  Substance Use Topics   Alcohol use: Yes    Comment: 10/17/2016 "nothing in 4-5 years; did drink 1 pint to 1 1/2 pints qd except Saturday; would drink 1/5 on Saturdays; not as much on Sundays"   Drug use: No     Allergies   Patient has no known allergies.   Review of Systems Review of Systems  Constitutional: Negative for appetite change, chills and fever.  HENT: Negative for ear pain, rhinorrhea, sneezing and sore throat.   Eyes: Negative for photophobia and visual disturbance.  Respiratory: Negative for cough, chest tightness, shortness of breath and wheezing.   Cardiovascular: Negative for chest pain and palpitations.  Gastrointestinal: Negative for abdominal pain, blood in stool, constipation, diarrhea, nausea and vomiting.  Genitourinary: Negative for dysuria, hematuria and urgency.  Musculoskeletal: Negative for myalgias.  Skin: Negative for rash.  Neurological: Positive for speech difficulty. Negative for dizziness, weakness and light-headedness.     Physical Exam Updated Vital Signs BP (!) 165/89 (BP Location: Right Arm)    Pulse 62    Temp 98.2 F (36.8 C) (Oral)    Resp 17    Ht 6\' 1"  (1.854 m)    Wt 77.1 kg    SpO2 98%    BMI 22.43 kg/m   Physical Exam Vitals signs and nursing note reviewed.  Constitutional:      General: He is not in acute distress.    Appearance: He is well-developed.  HENT:     Head:  Normocephalic and atraumatic.     Nose: Nose normal.  Eyes:     General: No scleral icterus.       Right eye: No discharge.        Left eye: No discharge.     Conjunctiva/sclera: Conjunctivae normal.     Pupils: Pupils are equal, round, and reactive to light.  Neck:     Musculoskeletal: Normal range of motion and neck supple.  Cardiovascular:     Rate and Rhythm: Normal rate and regular rhythm.     Heart sounds: Normal heart sounds. No murmur. No friction rub. No gallop.   Pulmonary:     Effort: Pulmonary effort is normal. No respiratory distress.  Breath sounds: Normal breath sounds.  Abdominal:     General: Bowel sounds are normal. There is no distension.     Palpations: Abdomen is soft.     Tenderness: There is no abdominal tenderness. There is no guarding.  Musculoskeletal: Normal range of motion.  Skin:    General: Skin is warm and dry.     Findings: No rash.  Neurological:     General: No focal deficit present.     Mental Status: He is alert and oriented to person, place, and time.     Cranial Nerves: No cranial nerve deficit.     Sensory: No sensory deficit.     Motor: No weakness or abnormal muscle tone.     Coordination: Coordination normal.     Comments: Pupils reactive. No facial asymmetry noted. Cranial nerves appear grossly intact. Sensation intact to light touch on face, BUE and BLE. Strength 5/5 in BUE and BLE.       ED Treatments / Results  Labs (all labs ordered are listed, but only abnormal results are displayed) Labs Reviewed  CBC - Abnormal; Notable for the following components:      Result Value   RBC 4.09 (*)    Hemoglobin 11.6 (*)    HCT 37.7 (*)    All other components within normal limits  COMPREHENSIVE METABOLIC PANEL - Abnormal; Notable for the following components:   Glucose, Bld 146 (*)    Total Protein 6.2 (*)    Albumin 3.4 (*)    All other components within normal limits  SARS CORONAVIRUS 2 (HOSPITAL ORDER, PERFORMED IN [place]  [facility])  PROTIME-INR  APTT  DIFFERENTIAL  I-STAT CHEM 8, ED  CBG MONITORING, ED  I-STAT BETA HCG BLOOD, ED ([facility], [facility], [facility] ONLY)    EKG EKG Interpretation  Date/Time:  Wednesday January 27 2019 14:53:42 EDT Ventricular Rate:  61 PR Interval:    QRS Duration: 94 QT Interval:  410 QTC Calculation: 413 R Axis:   -10 Text Interpretation:  Sinus rhythm Prolonged PR interval No significant change since last tracing Confirmed by Richardean Canal [phone]) on 01/27/2019 3:35:36 PM   Radiology Ct Angio Head W Or Wo Contrast  Addendum Date: 01/27/2019   ADDENDUM REPORT: 01/27/2019 17:21 ADDENDUM: Study discussed by telephone with PA-C Skie Vitrano on 01/27/2019 at 1712 hours. Electronically Signed   By: Odessa Fleming M.D.   On: 01/27/2019 17:21   Result Date: 01/27/2019 CLINICAL DATA:  73 year old male with slurred speech since 1300 hours. EXAM: CT ANGIOGRAPHY HEAD AND NECK TECHNIQUE: Multidetector CT imaging of the head and neck was performed using the standard protocol during bolus administration of intravenous contrast. Multiplanar CT image reconstructions and MIPs were obtained to evaluate the vascular anatomy. Carotid stenosis measurements (when applicable) are obtained utilizing NASCET criteria, using the distal internal carotid diameter as the denominator. CONTRAST:  75mL OMNIPAQUE IOHEXOL 350 MG/ML SOLN COMPARISON:  None. FINDINGS: CT HEAD Brain: Dural calcification suspected along the falx. No acute intracranial hemorrhage identified. No midline shift, mass effect, or evidence of intracranial mass lesion. No ventriculomegaly. Patchy hypodensity in the right corona radiata. Chronic appearing posterior right cerebellar infarct on series 5, image 10. No cortical encephalomalacia identified elsewhere. No cortically based acute infarct identified. Calvarium and skull base: Negative. Paranasal sinuses: Left maxillary sinus retention cyst, otherwise clear. Orbits: Visualized orbit soft tissues are within normal  limits. Visualized scalp soft tissues are within normal limits. CTA NECK Skeleton: Poor residual dentition. Advanced degenerative changes  in the cervical spine. No acute osseous abnormality identified. Upper chest: Centrilobular and paraseptal emphysema. Maximal to mildly enlarged precarinal and prevascular lymph nodes, up to 12 millimeter short axis. No apical lung nodule identified. Other neck: Negative. Aortic arch: 3 vessel arch configuration with mild ectasia and moderate soft and calcified plaque. Right carotid system: No brachiocephalic artery or right CCA origin stenosis despite plaque. Bulky soft and calcified plaque at the right ICA origin resulting in high-grade stenosis approaching a short radiographic string sign on series 7, image 90 and series 11, image 118. Superimposed plaque in the right ICA bulb, and in the distal bulb soft plaque results in 60% stenosis on series 11, image 116. Distal to that no stenosis to the skull base. Left carotid system: No left CCA origin stenosis despite plaque. Soft plaque in the anterior left CCA proximal to the bifurcation. Bulky calcified plaque at the left ICA origin and also the bulb. Subsequent left ICA origin stenosis numerically estimated at 70 % with respect to the distal vessel (series 9, image 126). No other stenosis to the skull base. Vertebral arteries: The right vertebral artery origin is occluded on series 7, image 55. There is mild upstream right subclavian artery stenosis related to soft and calcified plaque. Faint reconstitution in the right vertebral artery begins at the C2-C3 level, and the vessel is patent to the right PICA origin at the skull base. See additional findings below. No proximal left subclavian artery stenosis despite soft and calcified plaque. There is dense calcified plaque at the left vertebral artery origin resulting in moderate stenosis on series 9, image 161. The left vertebral artery appears dominant and remains patent to the  skull base. There is atherosclerotic irregularity of the vessel, especially at the V1 V2 junction but no additional stenosis to the skull base. CTA HEAD Posterior circulation: The right vertebral artery V4 segment occludes beyond the PICA origin. The left PICA origin is patent and normal. There is distal left V4 calcified plaque but no significant stenosis. The left vertebral supplies the basilar. The basilar artery is irregular but patent without significant stenosis. SCA and PCA origins are patent. The right posterior communicating artery is present, the left is diminutive or absent. There is moderate left P1 segment stenosis (series 10, image 27). There is mild to moderate right P1 stenosis. The bilateral PCA branches are irregular but appear patent. Anterior circulation: Both ICA siphons are patent but heavily calcified. On the left there is severe cavernous segment stenosis (series 11, image 163 and series 14, image 556) continuing through the supraclinoid segment. The right ICA terminus remains patent. On the right there is similar severe distal cavernous and proximal supraclinoid segment stenosis. The right posterior communicating artery origin is normal. The right ICA terminus remains patent. MCA and ACA origins are patent without significant stenosis. Anterior communicating artery is patent. There is moderate irregularity and stenosis of the distal left A2 segment (series 12, image 30). The left MCA M1 and left MCA bifurcation are patent without stenosis. There is mild to moderate irregularity and stenosis of a posterior left M2 branch on series 12, image 38. No left MCA branch occlusion identified. Right M1 and right MCA bifurcation are patent without stenosis. Right MCA branches are patent with mild to moderate M3 irregularity. No right MCA branch occlusion identified. Venous sinuses: Patent. Anatomic variants: Left vertebral artery supplies the basilar and appears dominant, but may be due to right  vertebral occlusion. Review of the MIP images confirms the  above findings IMPRESSION: 1. Negative for emergent large vessel occlusion, but Positive for: - occluded Right Vertebral Artery origin and V4 segments. The distal right V2 and right V3 are reconstituted and patent to the right PICA origin. - tandem High-grade Bilateral ICA stenoses. Severe bilateral ICA origin and bilateral ICA stenoses due to mostly calcified plaque. - Moderate stenosis of the dominant Left Vertebral Artery origin, the left vertebral supplies the Basilar. 2. Up to moderate stenoses of circle-of-Willis branches: Left P1, left A2, and bilateral M2/M3. 3. Small chronic right cerebellar infarct. No acute intracranial hemorrhage or acute cortically based infarct identified by CT. 4. Aortic Atherosclerosis (ICD10-I70.0) and Emphysema (ICD10-J43.9). Electronically Signed: By: Odessa FlemingH  Hall M.D. On: 01/27/2019 17:08   Ct Angio Neck W And/or Wo Contrast  Addendum Date: 01/27/2019   ADDENDUM REPORT: 01/27/2019 17:21 ADDENDUM: Study discussed by telephone with PA-C Dorotea Hand on 01/27/2019 at 1712 hours. Electronically Signed   By: Odessa FlemingH  Hall M.D.   On: 01/27/2019 17:21   Result Date: 01/27/2019 CLINICAL DATA:  73 year old male with slurred speech since 1300 hours. EXAM: CT ANGIOGRAPHY HEAD AND NECK TECHNIQUE: Multidetector CT imaging of the head and neck was performed using the standard protocol during bolus administration of intravenous contrast. Multiplanar CT image reconstructions and MIPs were obtained to evaluate the vascular anatomy. Carotid stenosis measurements (when applicable) are obtained utilizing NASCET criteria, using the distal internal carotid diameter as the denominator. CONTRAST:  75mL OMNIPAQUE IOHEXOL 350 MG/ML SOLN COMPARISON:  None. FINDINGS: CT HEAD Brain: Dural calcification suspected along the falx. No acute intracranial hemorrhage identified. No midline shift, mass effect, or evidence of intracranial mass lesion. No  ventriculomegaly. Patchy hypodensity in the right corona radiata. Chronic appearing posterior right cerebellar infarct on series 5, image 10. No cortical encephalomalacia identified elsewhere. No cortically based acute infarct identified. Calvarium and skull base: Negative. Paranasal sinuses: Left maxillary sinus retention cyst, otherwise clear. Orbits: Visualized orbit soft tissues are within normal limits. Visualized scalp soft tissues are within normal limits. CTA NECK Skeleton: Poor residual dentition. Advanced degenerative changes in the cervical spine. No acute osseous abnormality identified. Upper chest: Centrilobular and paraseptal emphysema. Maximal to mildly enlarged precarinal and prevascular lymph nodes, up to 12 millimeter short axis. No apical lung nodule identified. Other neck: Negative. Aortic arch: 3 vessel arch configuration with mild ectasia and moderate soft and calcified plaque. Right carotid system: No brachiocephalic artery or right CCA origin stenosis despite plaque. Bulky soft and calcified plaque at the right ICA origin resulting in high-grade stenosis approaching a short radiographic string sign on series 7, image 90 and series 11, image 118. Superimposed plaque in the right ICA bulb, and in the distal bulb soft plaque results in 60% stenosis on series 11, image 116. Distal to that no stenosis to the skull base. Left carotid system: No left CCA origin stenosis despite plaque. Soft plaque in the anterior left CCA proximal to the bifurcation. Bulky calcified plaque at the left ICA origin and also the bulb. Subsequent left ICA origin stenosis numerically estimated at 70 % with respect to the distal vessel (series 9, image 126). No other stenosis to the skull base. Vertebral arteries: The right vertebral artery origin is occluded on series 7, image 55. There is mild upstream right subclavian artery stenosis related to soft and calcified plaque. Faint reconstitution in the right vertebral  artery begins at the C2-C3 level, and the vessel is patent to the right PICA origin at the skull base. See additional findings  below. No proximal left subclavian artery stenosis despite soft and calcified plaque. There is dense calcified plaque at the left vertebral artery origin resulting in moderate stenosis on series 9, image 161. The left vertebral artery appears dominant and remains patent to the skull base. There is atherosclerotic irregularity of the vessel, especially at the V1 V2 junction but no additional stenosis to the skull base. CTA HEAD Posterior circulation: The right vertebral artery V4 segment occludes beyond the PICA origin. The left PICA origin is patent and normal. There is distal left V4 calcified plaque but no significant stenosis. The left vertebral supplies the basilar. The basilar artery is irregular but patent without significant stenosis. SCA and PCA origins are patent. The right posterior communicating artery is present, the left is diminutive or absent. There is moderate left P1 segment stenosis (series 10, image 27). There is mild to moderate right P1 stenosis. The bilateral PCA branches are irregular but appear patent. Anterior circulation: Both ICA siphons are patent but heavily calcified. On the left there is severe cavernous segment stenosis (series 11, image 163 and series 14, image 556) continuing through the supraclinoid segment. The right ICA terminus remains patent. On the right there is similar severe distal cavernous and proximal supraclinoid segment stenosis. The right posterior communicating artery origin is normal. The right ICA terminus remains patent. MCA and ACA origins are patent without significant stenosis. Anterior communicating artery is patent. There is moderate irregularity and stenosis of the distal left A2 segment (series 12, image 30). The left MCA M1 and left MCA bifurcation are patent without stenosis. There is mild to moderate irregularity and stenosis of  a posterior left M2 branch on series 12, image 38. No left MCA branch occlusion identified. Right M1 and right MCA bifurcation are patent without stenosis. Right MCA branches are patent with mild to moderate M3 irregularity. No right MCA branch occlusion identified. Venous sinuses: Patent. Anatomic variants: Left vertebral artery supplies the basilar and appears dominant, but may be due to right vertebral occlusion. Review of the MIP images confirms the above findings IMPRESSION: 1. Negative for emergent large vessel occlusion, but Positive for: - occluded Right Vertebral Artery origin and V4 segments. The distal right V2 and right V3 are reconstituted and patent to the right PICA origin. - tandem High-grade Bilateral ICA stenoses. Severe bilateral ICA origin and bilateral ICA stenoses due to mostly calcified plaque. - Moderate stenosis of the dominant Left Vertebral Artery origin, the left vertebral supplies the Basilar. 2. Up to moderate stenoses of circle-of-Willis branches: Left P1, left A2, and bilateral M2/M3. 3. Small chronic right cerebellar infarct. No acute intracranial hemorrhage or acute cortically based infarct identified by CT. 4. Aortic Atherosclerosis (ICD10-I70.0) and Emphysema (ICD10-J43.9). Electronically Signed: By: Genevie Ann M.D. On: 01/27/2019 17:08   Mr Brain Wo Contrast  Result Date: 01/27/2019 CLINICAL DATA:  Slurred speech and left-sided weakness. EXAM: MRI HEAD WITHOUT CONTRAST TECHNIQUE: Multiplanar, multiecho pulse sequences of the brain and surrounding structures were obtained without intravenous contrast. COMPARISON:  CT studies same day. FINDINGS: Brain: Diffusion imaging does not show any acute or subacute infarction. There is cerebellar atrophy. Few old small vessel cerebellar infarctions. No focal brainstem insult. Cerebral hemispheres show generalized atrophy with an old infarction in the radiating white matter tracts on the right. No large vessel territory insult. No mass  lesion, hemorrhage, hydrocephalus or extra-axial collection. Vascular: Major vessels at the base of the brain show flow. Skull and upper cervical spine: Negative Sinuses/Orbits: Clear/normal Other:  None IMPRESSION: No acute finding by MRI. Generalized atrophy. Old small vessel infarctions of the cerebellum and cerebral hemispheric white matter. Electronically Signed   By: Paulina FusiMark  Shogry M.D.   On: 01/27/2019 21:14    Procedures Procedures (including critical care time)  Medications Ordered in ED Medications  sodium chloride flush (NS) 0.9 % injection 3 mL (3 mLs Intravenous Given 01/27/19 1459)  iohexol (OMNIPAQUE) 350 MG/ML injection 75 mL (75 mLs Intravenous Contrast Given 01/27/19 1620)     Initial Impression / Assessment and Plan / ED Course  I have reviewed the triage vital signs and the nursing notes.  Pertinent labs & imaging results that were available during my care of the patient were reviewed by me and considered in my medical decision making (see chart for details).        73 year old male with past medical history of hypertension, hyperlipidemia, CAD, CHF presents to ED for sudden onset of slurred speech and confusion which lasted about 5 minutes.  Wife at bedside states that he was "stumbling while walking."  He states that he is back to baseline.  He denies any symptoms during my evaluation.  Denies headache, vision changes, numbness in arms or legs.  Reports history of similar symptoms in the past when he had a "small stroke."  On my exam he is overall well-appearing.  No deficits neurological exam noted.  He is alert and oriented x4.  CTA of the head and neck shows chronic stenoses in various arteries as noted above.  No acute large vessel occlusion.  Lab work including CBC, CMP, PT/INR unremarkable.  MRI of the brain is negative for acute abnormality.  Feel that he is suitable for TIA work-up outpatient.  Patient is comfortable with plan does not feel that he wants to be admitted  to hospital.  He continues to be symptom-free during the ED visit.  We will have him follow-up with PCP and neurology and return for worsening symptoms.  Patient is hemodynamically stable, in NAD, and able to ambulate in the ED. Evaluation does not show pathology that would require ongoing emergent intervention or inpatient treatment. I explained the diagnosis to the patient. Pain has been managed and has no complaints prior to discharge. Patient is comfortable with above plan and is stable for discharge at this time. All questions were answered prior to disposition. Strict return precautions for returning to the ED were discussed. Encouraged follow up with PCP.   An After Visit Summary was printed and given to the patient.   Portions of this note were generated with Scientist, clinical (histocompatibility and immunogenetics)Dragon dictation software. Dictation errors may occur despite best attempts at proofreading.   Final Clinical Impressions(s) / ED Diagnoses   Final diagnoses:  Aphasia    ED Discharge Orders         Ordered    Ambulatory referral to Neurology    Comments: An appointment is requested in approximately: 1 week   01/27/19 2206           Dietrich PatesKhatri, Tadhg Eskew, PA-C 01/27/19 2210    Charlynne PanderYao, David Hsienta, MD 01/28/19 2040

## 2019-01-27 NOTE — Discharge Instructions (Addendum)
You will need to follow-up with the neurologist for an outpatient work-up. Return to the ED if you start to have worsening symptoms, trouble speaking, numbness in arms or legs, blurry vision or falls.

## 2019-01-27 NOTE — ED Notes (Signed)
Patient transported to MRI 

## 2019-01-28 ENCOUNTER — Encounter: Payer: Self-pay | Admitting: Neurology

## 2019-02-24 ENCOUNTER — Ambulatory Visit: Payer: Medicare Other | Admitting: Neurology

## 2019-03-23 NOTE — Progress Notes (Deleted)
NEUROLOGY CONSULTATION NOTE  Randy Walter MRN: [id number] DOB: [birth year]  Referring provider: Dietrich Pates, PA-C (ED referral) Primary care provider: Rinaldo Cloud, MD  Reason for consult:  aphasia  HISTORY OF PRESENT ILLNESS: Randy Walter is a 73 year old ***-handed black male with CAD, CHF, hypertension, hyperlipidemia and history of CVA in 2018 who presents for aphasia.  History supplemented by ED note.  MRI brain and CTA of head and neck performed in the ED were personally reviewed.  On 01/27/19, he had an episode of slurred speech and confusion.  ***.  Symptoms lasted 5 minutes and spontaneously resolved by the time he reached the ED.  Neurologic exam in the ED was nonfocal.  CTA of head and neck revealed intracranial atherosclerotic disease with no emergent large vessel occlusion but did show occlusion of right vertebral artery origin and V4 segments with reconstituted and patent right V2 and V3 to the right PICA origin; high-grade bilateral ICA stenoses, moderate stenosis of left vertebral artery origin, which is the dominant vessel supplying the basilar; and up to moderate stenoses of circle-of-Willis branches, left P1, left A2 and bilateral M2/M3 segements.  MRI of brain showed generalized atrophy and old small vessel infarcts in the cerebellum and right cerebral hemispheric white matter but no acute findings.  He was discharged with no change in his medications, including ASA 81mg , Lipitor 10mg , and Coreg.  He had a similar event in ***  He was hospitalized in March 2007 for CVA.  He presented with left sided weakness.  MRI of brain showed acute right frontoparietal infarct in the periventricular white matter.  MRA of head showed moderate intracranial atherosclerosis.  PAST MEDICAL HISTORY: Past Medical History:  Diagnosis Date  . Arthritis    "it moves; knees, shoulders" (10/17/2016)  . CAD (coronary artery disease)   . CHF (congestive heart failure) (HCC)    April 2007  10/17/2016  . COPD (chronic obstructive pulmonary disease) (HCC)    Hattie Perch 10/17/2016  . Dyspnea    w/ exertion   . High cholesterol   . Hypertension   . Stroke (HCC)    Hattie Perch 10/17/2016  . Tobacco use     PAST SURGICAL HISTORY: Past Surgical History:  Procedure Laterality Date  . CORONARY ANGIOPLASTY WITH STENT PLACEMENT  2007   to mid LAD /notes 10/17/2016  . INGUINAL HERNIA REPAIR Right 04/08/2017   Procedure: RIGHT INGUINAL HERNIA REPAIR  WITH MESH;  Surgeon: 10/19/2016, MD;  Location: [facility] OR;  Service: General;  Laterality: Right;  . INSERTION OF MESH N/A 04/08/2017   Procedure: INSERTION OF MESH;  Surgeon: [facility], MD;  Location: [facility] OR;  Service: General;  Laterality: N/A;  . LEFT HEART CATH AND CORONARY ANGIOGRAPHY N/A 10/18/2016   Procedure: Left Heart Cath and Coronary Angiography;  Surgeon: [facility], MD;  Location: [facility] INVASIVE [facility] LAB;  Service: Cardiovascular;  Laterality: N/A;  . UMBILICAL HERNIA REPAIR N/A 04/08/2017   Procedure: UMBILICAL HERNIA REPAIR WITH MESH;  Surgeon: Rinaldo Cloud, MD;  Location: [facility];  Service: General;  Laterality: N/A;    MEDICATIONS: Current Outpatient Medications on File Prior to Visit  Medication Sig Dispense Refill  . aspirin EC 81 MG tablet Take 1 tablet (81 mg total) by mouth daily.    Manus Rudd atorvastatin (LIPITOR) 10 MG tablet Take 1 tablet (10 mg total) by mouth daily. 30 tablet 1  . carvedilol (COREG) 6.25 MG tablet Take 6.25 mg by mouth 2 (two) times daily with a meal.    .  gabapentin (NEURONTIN) 300 MG capsule Take 1 capsule (300 mg total) by mouth 3 (three) times daily. 15 capsule 0  . HYDROcodone-acetaminophen (NORCO/VICODIN) 5-325 MG tablet Take 1 tablet by mouth every 6 (six) hours as needed for moderate pain. 30 tablet 0  . lisinopril (PRINIVIL,ZESTRIL) 20 MG tablet Take 40 mg by mouth 2 (two) times daily.     . nitroGLYCERIN (NITROSTAT) 0.4 MG SL tablet Place 1 tablet (0.4 mg total) under the tongue every 5 (five)  minutes x 3 doses as needed for chest pain. 25 tablet 12  . polyethylene glycol (MIRALAX / GLYCOLAX) packet Take 17 g by mouth 2 (two) times daily as needed for mild constipation or moderate constipation. 14 each 0  . traMADol (ULTRAM) 50 MG tablet Take 1 tablet (50 mg total) by mouth every 6 (six) hours as needed. 12 tablet 0   No current facility-administered medications on file prior to visit.     ALLERGIES: No Known Allergies  FAMILY HISTORY: Family History  Problem Relation Age of Onset  . CAD Maternal Grandmother    ***.  SOCIAL HISTORY: Social History   Socioeconomic History  . Marital status: Married    Spouse name: Not on file  . Number of children: Not on file  . Years of education: Not on file  . Highest education level: Not on file  Occupational History  . Not on file  Social Needs  . Financial resource strain: Not on file  . Food insecurity    Worry: Not on file    Inability: Not on file  . Transportation needs    Medical: Not on file    Non-medical: Not on file  Tobacco Use  . Smoking status: Current Every Day Smoker    Packs/day: 0.50    Years: 60.00    Pack years: 30.00    Types: Cigarettes  . Smokeless tobacco: Former Systems developer    Types: Snuff, Chew  . Tobacco comment: 10/17/2016 "no chew or snuff for 50 or 60 years"  Substance and Sexual Activity  . Alcohol use: Yes    Comment: 10/17/2016 "nothing in 4-5 years; did drink 1 pint to 1 1/2 pints qd except Saturday; would drink 1/5 on Saturdays; not as much on Sundays"  . Drug use: No  . Sexual activity: Not Currently  Lifestyle  . Physical activity    Days per week: Not on file    Minutes per session: Not on file  . Stress: Not on file  Relationships  . Social Herbalist on phone: Not on file    Gets together: Not on file    Attends religious service: Not on file    Active member of club or organization: Not on file    Attends meetings of clubs or organizations: Not on file     Relationship status: Not on file  . Intimate partner violence    Fear of current or ex partner: Not on file    Emotionally abused: Not on file    Physically abused: Not on file    Forced sexual activity: Not on file  Other Topics Concern  . Not on file  Social History Narrative  . Not on file    REVIEW OF SYSTEMS: Constitutional: No fevers, chills, or sweats, no generalized fatigue, change in appetite Eyes: No visual changes, double vision, eye pain Ear, nose and throat: No hearing loss, ear pain, nasal congestion, sore throat Cardiovascular: No chest pain, palpitations Respiratory:  No shortness  of breath at rest or with exertion, wheezes GastrointestinaI: No nausea, vomiting, diarrhea, abdominal pain, fecal incontinence Genitourinary:  No dysuria, urinary retention or frequency Musculoskeletal:  No neck pain, back pain Integumentary: No rash, pruritus, skin lesions Neurological: as above Psychiatric: No depression, insomnia, anxiety Endocrine: No palpitations, fatigue, diaphoresis, mood swings, change in appetite, change in weight, increased thirst Hematologic/Lymphatic:  No purpura, petechiae. Allergic/Immunologic: no itchy/runny eyes, nasal congestion, recent allergic reactions, rashes  PHYSICAL EXAM: *** General: No acute distress.  Patient appears ***-groomed.  *** Head:  Normocephalic/atraumatic Eyes:  fundi examined but not visualized Neck: supple, no paraspinal tenderness, full range of motion Back: No paraspinal tenderness Heart: regular rate and rhythm Lungs: Clear to auscultation bilaterally. Vascular: No carotid bruits. Neurological Exam: Mental status: alert and oriented to person, place, and time, recent and remote memory intact, fund of knowledge intact, attention and concentration intact, speech fluent and not dysarthric, language intact. Cranial nerves: CN I: not tested CN II: pupils equal, round and reactive to light, visual fields intact CN III, IV, VI:   full range of motion, no nystagmus, no ptosis CN V: facial sensation intact CN VII: upper and lower face symmetric CN VIII: hearing intact CN IX, X: gag intact, uvula midline CN XI: sternocleidomastoid and trapezius muscles intact CN XII: tongue midline Bulk & Tone: normal, no fasciculations. Motor:  5/5 throughout *** Sensation:  Pinprick *** temperature *** and vibration sensation intact.  ***. Deep Tendon Reflexes:  2+ throughout, *** toes downgoing.  *** Finger to nose testing:  Without dysmetria.  *** Heel to shin:  Without dysmetria.  *** Gait:  Normal station and stride.  Able to turn and tandem walk. Romberg ***.  IMPRESSION: ***  PLAN: ***  Thank you for allowing me to take part in the care of this patient.  Shon Millet, DO  CC: ***

## 2019-03-24 ENCOUNTER — Ambulatory Visit: Payer: Medicare Other | Admitting: Neurology

## 2019-04-02 NOTE — Progress Notes (Signed)
NEUROLOGY CONSULTATION NOTE  Randy Walter MRN: [id number] DOB: [birth year]  Referring provider: Delia Heady, PA-C (ED referral) Primary care provider: [name], MD  Reason for consult:  aphasia  HISTORY OF PRESENT ILLNESS: Randy Walter is a 73 year old right-handed black male with CAD, CHF, hypertension, hyperlipidemia, tobacco use and history of CVA in 2018 who presents for aphasia.  History supplemented by ED note.  MRI brain and CTA of head and neck performed in the ED were personally reviewed.  On 01/27/19, he had an episode of slurred speech and trouble getting words out.  Symptoms lasted 5 minutes and spontaneously resolved by the time he reached the ED.  Neurologic exam in the ED was nonfocal.  CTA of head and neck revealed intracranial atherosclerotic disease with no emergent large vessel occlusion but did show occlusion of right vertebral artery origin and V4 segments with reconstituted and patent right V2 and V3 to the right PICA origin; high-grade bilateral ICA stenoses, moderate stenosis of left vertebral artery origin, which is the dominant vessel supplying the basilar; and up to moderate stenoses of circle-of-Willis branches, left P1, left A2 and bilateral M2/M3 segements.  MRI of brain showed generalized atrophy and old small vessel infarcts in the cerebellum and right cerebral hemispheric white matter but no acute findings.  He was discharged with no change in his medications, including ASA 81mg , Lipitor 10mg , and Coreg.  He followed up with his PCP who started him on Plavix.  He was hospitalized in March 2007 for CVA.  He presented with left sided weakness.  MRI of brain showed acute right frontoparietal infarct in the periventricular white matter.  MRA of head showed moderate intracranial atherosclerosis.  PAST MEDICAL HISTORY: Past Medical History:  Diagnosis Date  . Arthritis    "it moves; knees, shoulders" (10/17/2016)  . CAD (coronary artery disease)   . CHF  (congestive heart failure) ([place])    Archie Endo 10/17/2016  . COPD (chronic obstructive pulmonary disease) ([place])    Archie Endo 10/17/2016  . Dyspnea    w/ exertion   . High cholesterol   . Hypertension   . Stroke ([place])    Archie Endo 10/17/2016  . Tobacco use     PAST SURGICAL HISTORY: Past Surgical History:  Procedure Laterality Date  . CORONARY ANGIOPLASTY WITH STENT PLACEMENT  2007   to mid LAD /notes 10/17/2016  . INGUINAL HERNIA REPAIR Right 04/08/2017   Procedure: RIGHT INGUINAL HERNIA REPAIR  WITH MESH;  Surgeon: Donnie Mesa, MD;  Location: [facility];  Service: General;  Laterality: Right;  . INSERTION OF MESH N/A 04/08/2017   Procedure: INSERTION OF MESH;  Surgeon: Donnie Mesa, MD;  Location: [facility];  Service: General;  Laterality: N/A;  . LEFT HEART CATH AND CORONARY ANGIOGRAPHY N/A 10/18/2016   Procedure: Left Heart Cath and Coronary Angiography;  Surgeon: [name], MD;  Location: [facility];  Service: Cardiovascular;  Laterality: N/A;  . UMBILICAL HERNIA REPAIR N/A 04/08/2017   Procedure: UMBILICAL HERNIA REPAIR WITH MESH;  Surgeon: Donnie Mesa, MD;  Location: [facility];  Service: General;  Laterality: N/A;    MEDICATIONS: Current Outpatient Medications on File Prior to Visit  Medication Sig Dispense Refill  . aspirin EC 81 MG tablet Take 1 tablet (81 mg total) by mouth daily.    Marland Kitchen atorvastatin (LIPITOR) 10 MG tablet Take 1 tablet (10 mg total) by mouth daily. 30 tablet 1  . carvedilol (COREG) 6.25 MG tablet Take 6.25 mg by mouth  2 (two) times daily with a meal.    . gabapentin (NEURONTIN) 300 MG capsule Take 1 capsule (300 mg total) by mouth 3 (three) times daily. 15 capsule 0  . HYDROcodone-acetaminophen (NORCO/VICODIN) 5-325 MG tablet Take 1 tablet by mouth every 6 (six) hours as needed for moderate pain. 30 tablet 0  . lisinopril (PRINIVIL,ZESTRIL) 20 MG tablet Take 40 mg by mouth 2 (two) times daily.     . nitroGLYCERIN (NITROSTAT) 0.4 MG SL tablet Place 1 tablet (0.4 mg  total) under the tongue every 5 (five) minutes x 3 doses as needed for chest pain. 25 tablet 12  . polyethylene glycol (MIRALAX / GLYCOLAX) packet Take 17 g by mouth 2 (two) times daily as needed for mild constipation or moderate constipation. 14 each 0  . traMADol (ULTRAM) 50 MG tablet Take 1 tablet (50 mg total) by mouth every 6 (six) hours as needed. 12 tablet 0   No current facility-administered medications on file prior to visit.     ALLERGIES: No Known Allergies  FAMILY HISTORY: Family History  Problem Relation Age of Onset  . CAD Maternal Grandmother    SOCIAL HISTORY: Social History   Socioeconomic History  . Marital status: Married    Spouse name: Not on file  . Number of children: Not on file  . Years of education: Not on file  . Highest education level: Not on file  Occupational History  . Not on file  Social Needs  . Financial resource strain: Not on file  . Food insecurity    Worry: Not on file    Inability: Not on file  . Transportation needs    Medical: Not on file    Non-medical: Not on file  Tobacco Use  . Smoking status: Current Every Day Smoker    Packs/day: 0.50    Years: 60.00    Pack years: 30.00    Types: Cigarettes  . Smokeless tobacco: Former NeurosurgeonUser    Types: Snuff, Chew  . Tobacco comment: 10/17/2016 "no chew or snuff for 50 or 60 years"  Substance and Sexual Activity  . Alcohol use: Yes    Comment: 10/17/2016 "nothing in 4-5 years; did drink 1 pint to 1 1/2 pints qd except Saturday; would drink 1/5 on Saturdays; not as much on Sundays"  . Drug use: No  . Sexual activity: Not Currently  Lifestyle  . Physical activity    Days per week: Not on file    Minutes per session: Not on file  . Stress: Not on file  Relationships  . Social Musicianconnections    Talks on phone: Not on file    Gets together: Not on file    Attends religious service: Not on file    Active member of club or organization: Not on file    Attends meetings of clubs or  organizations: Not on file    Relationship status: Not on file  . Intimate partner violence    Fear of current or ex partner: Not on file    Emotionally abused: Not on file    Physically abused: Not on file    Forced sexual activity: Not on file  Other Topics Concern  . Not on file  Social History Narrative  . Not on file    REVIEW OF SYSTEMS: Constitutional: No fevers, chills, or sweats, no generalized fatigue, change in appetite Eyes: No visual changes, double vision, eye pain Ear, nose and throat: No hearing loss, ear pain, nasal congestion, sore throat  Cardiovascular: No chest pain, palpitations Respiratory:  No shortness of breath at rest or with exertion, wheezes GastrointestinaI: No nausea, vomiting, diarrhea, abdominal pain, fecal incontinence Genitourinary:  No dysuria, urinary retention or frequency Musculoskeletal:  No neck pain, back pain Integumentary: No rash, pruritus, skin lesions Neurological: as above Psychiatric: No depression, insomnia, anxiety Endocrine: No palpitations, fatigue, diaphoresis, mood swings, change in appetite, change in weight, increased thirst Hematologic/Lymphatic:  No purpura, petechiae. Allergic/Immunologic: no itchy/runny eyes, nasal congestion, recent allergic reactions, rashes  PHYSICAL EXAM: Blood pressure (!) 143/82, pulse 81, temperature 98.2 F (36.8 C), height 6\' 1"  (1.854 m), weight 178 lb (80.7 kg), SpO2 95 %. General: No acute distress.  Patient appears well-groomed.   Head:  Normocephalic/atraumatic Eyes:  fundi examined but not visualized Neck: supple, no paraspinal tenderness, full range of motion Back: No paraspinal tenderness Heart: regular rate and rhythm Lungs: Clear to auscultation bilaterally. Vascular: No carotid bruits. Neurological Exam: Mental status: alert and oriented to person, place, and time, recent and remote memory intact, fund of knowledge intact, attention and concentration intact, speech fluent and not  dysarthric, language intact. Cranial nerves: CN I: not tested CN II: pupils equal, round and reactive to light, visual fields intact CN III, IV, VI:  full range of motion, no nystagmus, no ptosis CN V: facial sensation intact CN VII: upper and lower face symmetric CN VIII: hearing intact CN IX, X: gag intact, uvula midline CN XI: sternocleidomastoid and trapezius muscles intact CN XII: tongue midline Bulk & Tone: normal, no fasciculations. Motor:  5/5 throughout Sensation:  temperature and vibration sensation intact.  Deep Tendon Reflexes:  2+ throughout, toes downgoing.   Finger to nose testing:  Without dysmetria.   Heel to shin:  Without dysmetria.   Gait:  Normal station and stride.  Able to turn and tandem walk. Romberg negative.  IMPRESSION: 1.   Probable transient ischemic attack 2.  Hypertension 3.  Hyperlipidemia 4.  Carotid artery disease/intracranial atherosclerosis 5.  Tobacco abuse 6.  Coronary artery disease  PLAN: 1.  Continue Plavix 75mg  daily and ASA 81mg  daily.  Unless he needs to remain on ASA for cardiac reasons, he may discontinue ASA in one month and just continue Plavix alone for secondary stroke prevention. 2.  Atorvastatin 10mg  daily.  LDL goal less than 70.  Check lipid panel today and adjust dose accordingly  3.  Follow up with PCP to optimize blood pressure control 4.  Check echocardiogram 5.  Tobacco cessation counseling (CPT 99406):  Tobacco use with history of CAD, stroke, or cancer  - Currently smoking 2 cigarettes/day   - Patient was informed of the dangers of tobacco abuse including stroke, cancer, and MI, as well as benefits of tobacco cessation. - Patient is not willing to quit at this time. - Approximately 5 mins were spent counseling patient cessation techniques. We discussed various methods to help quit smoking, including deciding on a date to quit, joining a support group, pharmacological agents- nicotine gum/patch/lozenges, chantix.  -  I will reassess his progress at the next follow-up visit 6.  Follow up in 4 months.  Thank you for allowing me to take part in the care of this patient.  , DO  CC: , MD

## 2019-04-05 ENCOUNTER — Encounter: Payer: Self-pay | Admitting: Neurology

## 2019-04-05 ENCOUNTER — Ambulatory Visit (INDEPENDENT_AMBULATORY_CARE_PROVIDER_SITE_OTHER): Payer: Medicare Other | Admitting: Neurology

## 2019-04-05 ENCOUNTER — Other Ambulatory Visit (INDEPENDENT_AMBULATORY_CARE_PROVIDER_SITE_OTHER): Payer: Medicare Other

## 2019-04-05 ENCOUNTER — Other Ambulatory Visit: Payer: Self-pay

## 2019-04-05 VITALS — BP 143/82 | HR 81 | Temp 98.2°F | Ht 73.0 in | Wt 178.0 lb

## 2019-04-05 DIAGNOSIS — G459 Transient cerebral ischemic attack, unspecified: Secondary | ICD-10-CM

## 2019-04-05 DIAGNOSIS — I6523 Occlusion and stenosis of bilateral carotid arteries: Secondary | ICD-10-CM

## 2019-04-05 DIAGNOSIS — I672 Cerebral atherosclerosis: Secondary | ICD-10-CM | POA: Diagnosis not present

## 2019-04-05 DIAGNOSIS — Z720 Tobacco use: Secondary | ICD-10-CM

## 2019-04-05 DIAGNOSIS — F17210 Nicotine dependence, cigarettes, uncomplicated: Secondary | ICD-10-CM | POA: Diagnosis not present

## 2019-04-05 DIAGNOSIS — I10 Essential (primary) hypertension: Secondary | ICD-10-CM | POA: Diagnosis not present

## 2019-04-05 DIAGNOSIS — E785 Hyperlipidemia, unspecified: Secondary | ICD-10-CM

## 2019-04-05 LAB — LIPID PANEL
Cholesterol: 117 mg/dL (ref 0–200)
HDL: 43.2 mg/dL (ref >39.00)
LDL Cholesterol: 56 mg/dL (ref 0–99)
NonHDL: 73.89
Total CHOL/HDL Ratio: 3
Triglycerides: 91 mg/dL (ref 0.0–149.0)
VLDL: 18.2 mg/dL (ref 0.0–40.0)

## 2019-04-05 NOTE — Patient Instructions (Signed)
1. Continue clopidogrel 75mg  daily and aspirin 81mg  daily 2.  We will check a lipid panel and change dose of atorvastatin accordingly. 3.  We will check echocardiogram 4.  Continue blood pressure medication 5.  Try to work on quitting smoking 6.  Follow up in 4 months.

## 2019-04-06 ENCOUNTER — Telehealth: Payer: Self-pay | Admitting: *Deleted

## 2019-04-06 NOTE — Telephone Encounter (Addendum)
  Spoke to patient and relayed below info from MD - verbalizes understanding.  ----- Message from Pieter Partridge, DO sent at 04/06/2019  7:20 AM EDT -----    Lipid panel looks okay.  Continue current dose of atorvastatin.

## 2019-04-09 ENCOUNTER — Encounter (HOSPITAL_COMMUNITY): Payer: Self-pay | Admitting: Neurology

## 2019-04-13 ENCOUNTER — Telehealth: Payer: Self-pay | Admitting: Neurology

## 2019-04-13 NOTE — Telephone Encounter (Signed)
Patient's wife called requesting a call back to discuss her husband's medication, lisinopril. She wants to know if he needs to take both doses, 20 MG and 40 MG.   [employer]

## 2019-04-14 NOTE — Telephone Encounter (Signed)
No answer at 1109  

## 2019-04-14 NOTE — Telephone Encounter (Signed)
Patient notified to contact his PCP regarding medications for blood pressure

## 2019-04-14 NOTE — Telephone Encounter (Signed)
I am not managing his blood pressure.  He should address this with his PCP

## 2019-04-14 NOTE — Telephone Encounter (Signed)
Can you clairfy the correct dosage?

## 2019-04-19 ENCOUNTER — Telehealth (HOSPITAL_COMMUNITY): Payer: Self-pay

## 2019-04-19 NOTE — Telephone Encounter (Signed)
New message    Just an FYI. We have made several attempts to contact this patient including sending a letter to schedule or reschedule their echocardiogram. We will be removing the patient from the echo [place].   10.14.20 @ 2:31pm both # are the same - lm on vm  - Jacaden Forbush  10.9.20 mail reminder letter - Rhone Ozaki   10.6.20 @ 12:31pm patient will have wife to call to set up - Zurii Hewes

## 2019-07-23 ENCOUNTER — Encounter: Payer: Self-pay | Admitting: Neurology

## 2019-08-09 NOTE — Progress Notes (Deleted)
Virtual Visit via Video Note The purpose of this virtual visit is to provide medical care while limiting exposure to the novel coronavirus.    Consent was obtained for video visit:  Yes.   Answered questions that patient had about telehealth interaction:  Yes.   I discussed the limitations, risks, security and privacy concerns of performing an evaluation and management service by telemedicine. I also discussed with the patient that there may be a patient responsible charge related to this service. The patient expressed understanding and agreed to proceed.  Pt location: Home Physician Location: office Name of referring provider:  [name], MD I connected with Randy Walter at patients initiation/request on 08/10/2019 at  3:30 PM EST by video enabled telemedicine application and verified that I am speaking with the correct person using two identifiers. Pt MRN:  [id number] Pt DOB:  10-24-1945 Video Participants:  Randy Walter   History of Present Illness:  Randy Walter is a 74 year old right-handed black male with CAD, CHF, hypertension, hyperlipidemia, tobacco use and history of CVA in 2018 who follows up for TIA.  UPDATE: Current medications:  Plavix 75mg  daily; atorvastatin 10mg ; ***  LDL from 04/05/2019 was 56 Echocardiogram was ordered but never performed.  ***   HISTORY: On 01/27/19, he had an episode of slurred speech and trouble getting words out.  Symptoms lasted 5 minutes and spontaneously resolved by the time he reached the ED.  Neurologic exam in the ED was nonfocal.  CTA of head and neck revealed intracranial atherosclerotic disease with no emergent large vessel occlusion but did show occlusion of right vertebral artery origin and V4 segments with reconstituted and patent right V2 and V3 to the right PICA origin; high-grade bilateral ICA stenoses, moderate stenosis of left vertebral artery origin, which is the dominant vessel supplying the basilar; and up to moderate  stenoses of circle-of-Willis branches, left P1, left A2 and bilateral M2/M3 segements.  MRI of brain showed generalized atrophy and old small vessel infarcts in the cerebellum and right cerebral hemispheric white matter but no acute findings.  He was discharged with no change in his medications, including ASA 81mg , Lipitor 10mg , and Coreg.  He followed up with his PCP who started him on Plavix.  He was hospitalized in March 2007 for CVA.  He presented with left sided weakness.  MRI of brain showed acute right frontoparietal infarct in the periventricular white matter.  MRA of head showed moderate intracranial atherosclerosis.  Past Medical History: Past Medical History:  Diagnosis Date  . Arthritis    "it moves; knees, shoulders" (10/17/2016)  . CAD (coronary artery disease)   . CHF (congestive heart failure) ([place])    Archie Endo 10/17/2016  . COPD (chronic obstructive pulmonary disease) ([place])    Archie Endo 10/17/2016  . Dyspnea    w/ exertion   . High cholesterol   . Hypertension   . Stroke ([place])    Archie Endo 10/17/2016  . Tobacco use     Medications: Outpatient Encounter Medications as of 08/10/2019  Medication Sig Note  . amLODipine (NORVASC) 10 MG tablet Take 10 mg by mouth daily.   Marland Kitchen aspirin EC 81 MG tablet Take 1 tablet (81 mg total) by mouth daily.   Marland Kitchen atorvastatin (LIPITOR) 10 MG tablet Take 1 tablet (10 mg total) by mouth daily.   . carvedilol (COREG) 12.5 MG tablet Take 12.5 mg by mouth 2 (two) times daily.   . clopidogrel (PLAVIX) 75 MG tablet TAKE 1 TABLET  BY MOUTH ONCE DAILY WITH FOOD   . gabapentin (NEURONTIN) 300 MG capsule Take 1 capsule (300 mg total) by mouth 3 (three) times daily. (Patient not taking: Reported on 04/05/2019)   . HYDROcodone-acetaminophen (NORCO/VICODIN) 5-325 MG tablet Take 1 tablet by mouth every 6 (six) hours as needed for moderate pain. (Patient not taking: Reported on 04/05/2019)   . lisinopril (PRINIVIL,ZESTRIL) 20 MG tablet Take 40 mg by mouth 2 (two) times  daily.    Marland Kitchen lisinopril (ZESTRIL) 40 MG tablet Take 40 mg by mouth 2 (two) times daily.   . nitroGLYCERIN (NITROSTAT) 0.4 MG SL tablet Place 1 tablet (0.4 mg total) under the tongue every 5 (five) minutes x 3 doses as needed for chest pain. 04/05/2019: Havent needed  . polyethylene glycol (MIRALAX / GLYCOLAX) packet Take 17 g by mouth 2 (two) times daily as needed for mild constipation or moderate constipation. (Patient not taking: Reported on 04/05/2019)   . traMADol (ULTRAM) 50 MG tablet Take 1 tablet (50 mg total) by mouth every 6 (six) hours as needed. (Patient not taking: Reported on 04/05/2019)    No facility-administered encounter medications on file as of 08/10/2019.    Allergies: No Known Allergies  Family History: Family History  Problem Relation Age of Onset  . CAD Maternal Grandmother     Social History: Social History   Socioeconomic History  . Marital status: Married    Spouse name: Not on file  . Number of children: Not on file  . Years of education: Not on file  . Highest education level: Not on file  Occupational History  . Not on file  Tobacco Use  . Smoking status: Current Every Day Smoker    Packs/day: 0.50    Years: 60.00    Pack years: 30.00    Types: Cigarettes  . Smokeless tobacco: Former Neurosurgeon    Types: Snuff, Chew  . Tobacco comment: only few cigarrettes a day now   Substance and Sexual Activity  . Alcohol use: Yes    Comment: 10/17/2016 "nothing in 4-5 years; did drink 1 pint to 1 1/2 pints qd except Saturday; would drink 1/5 on Saturdays; not as much on Sundays"  . Drug use: No  . Sexual activity: Not Currently  Other Topics Concern  . Not on file  Social History Narrative   Right handed       Live with wife in one level home      Caffeine - 2 coffee cups a day / 12 oz soda during day      Exercise - no      Education - 10th grade      Retired   International aid/development worker of [employer] Strain:   . Difficulty of Paying Living  Expenses: Not on file  Food Insecurity:   . Worried About Programme researcher, broadcasting/film/video in the Last Year: Not on file  . Ran Out of Food in the Last Year: Not on file  Transportation Needs:   . Lack of Transportation (Medical): Not on file  . Lack of Transportation (Non-Medical): Not on file  Physical Activity:   . Days of Exercise per Week: Not on file  . Minutes of Exercise per Session: Not on file  Stress:   . Feeling of Stress : Not on file  Social Connections:   . Frequency of Communication with Friends and Family: Not on file  . Frequency of Social Gatherings with Friends and Family: Not on file  .  Attends Religious Services: Not on file  . Active Member of Clubs or Organizations: Not on file  . Attends Banker Meetings: Not on file  . Marital Status: Not on file  Intimate Partner Violence:   . Fear of Current or Ex-Partner: Not on file  . Emotionally Abused: Not on file  . Physically Abused: Not on file  . Sexually Abused: Not on file    Observations/Objective:   *** No acute distress.  Alert and oriented.  Speech fluent and not dysarthric.  Language intact.  Eyes orthophoric on primary gaze.  Face symmetric.  Assessment and Plan:   1.  Transient ischemic attack presenting as aphasia 2.  Hypertension 3.  Hyperlipidemia 4.  Carotid artery disease/intracranial atherosclerosis 5.  Tobacco abuse 6.  Coronary artery disease  1.  Plavix 75mg  daily for secondary stroke prevention 2.  Atorvastatin 10mg  daily (LDL goal less than 70) 3.  ***  Follow Up Instructions:    -I discussed the assessment and treatment plan with the patient. The patient was provided an opportunity to ask questions and all were answered. The patient agreed with the plan and demonstrated an understanding of the instructions.   The patient was advised to call back or seek an in-person evaluation if the symptoms worsen or if the condition fails to improve as anticipated.    Total Time spent in  visit with the patient was:  ***, of which more than 50% of the time was spent in counseling and/or coordinating care on ***.   Pt understands and agrees with the plan of care outlined.     , DO

## 2019-08-10 ENCOUNTER — Ambulatory Visit: Payer: Medicare Other | Admitting: Neurology

## 2019-10-06 IMAGING — MR MRI HEAD WITHOUT CONTRAST
9 of 11 series · 33 of 48 positions shown · non-contrast
Comparison: CT studies same day.

CLINICAL DATA: Slurred speech and left-sided weakness.

EXAM:
MRI HEAD WITHOUT CONTRAST
TECHNIQUE: Multiplanar, multiecho pulse sequences of the brain and surrounding
structures were obtained without intravenous contrast.

[Series 2: DWI · axial · 3.0mm · 0.94mm/px · z∈[-142,-4]mm · 7 of 100 slices shown (1 of 2)]
[im 1/100]
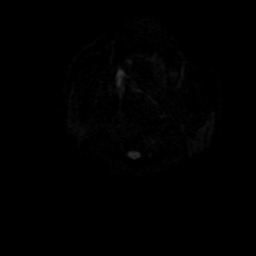
[im 17/100]
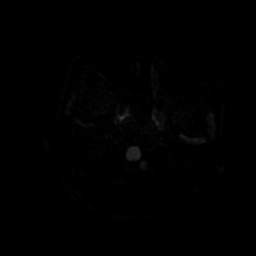
[im 34/100]
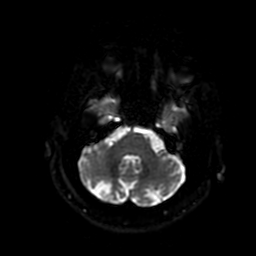
[im 50/100]
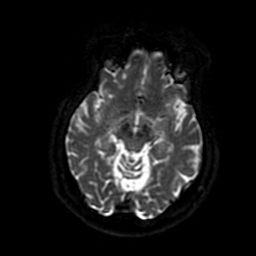
[im 67/100]
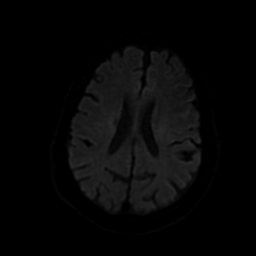
[im 83/100]
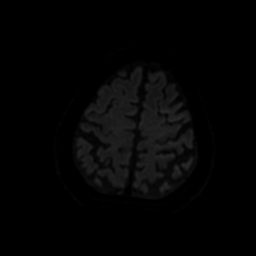
[im 100/100]
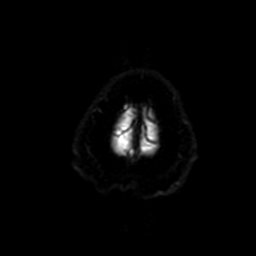

[Series 3: DWI · coronal · 4.0mm · 0.94mm/px · 6 of 72 slices shown (2 of 2)]
[im 1/72]
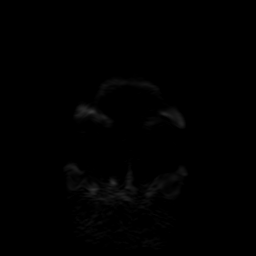
[im 15/72]
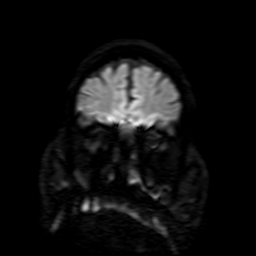
[im 29/72]
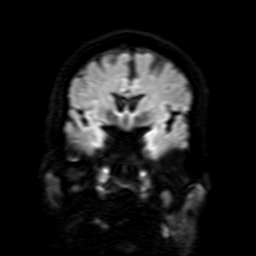
[im 43/72]
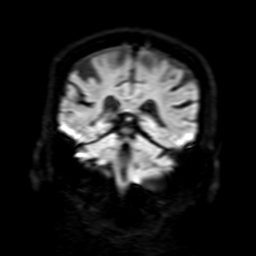
[im 57/72]
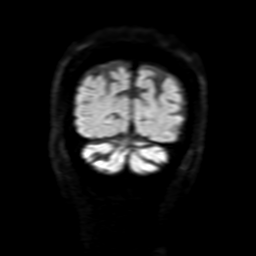
[im 72/72]
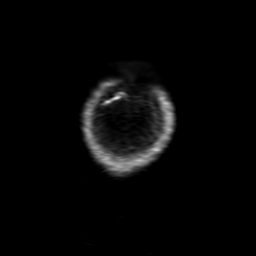

[Series 4: FLAIR · sagittal · 5.0mm · 0.47mm/px · 2 of 23 slices shown (1 of 2)]
[im 1/23]
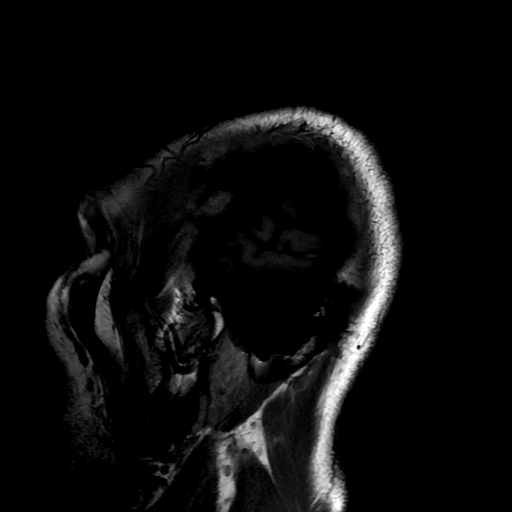
[im 23/23]
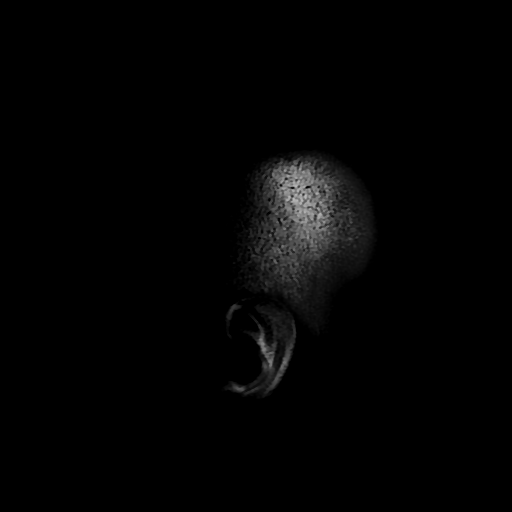

[Series 5: T2 · axial · 5.0mm · 0.47mm/px · z∈[-136,-1]mm · 2 of 25 slices shown (1 of 2)]
[im 1/25]
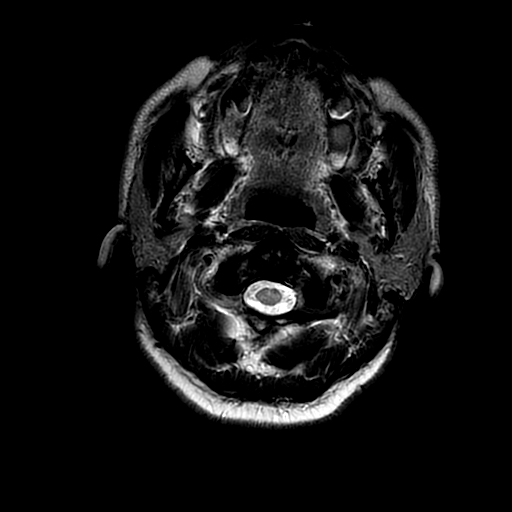
[im 25/25]
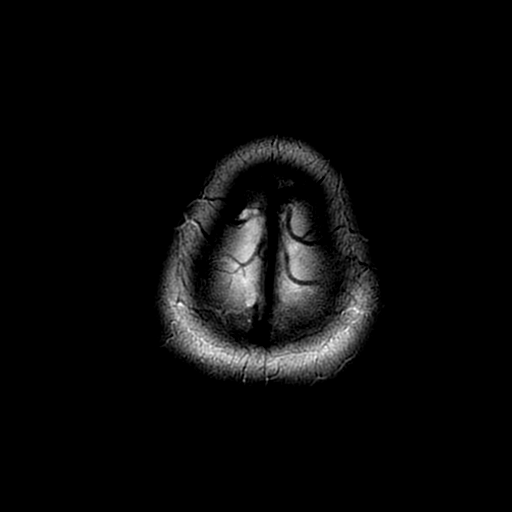

[Series 6: FLAIR · axial · 5.0mm · 0.47mm/px · z∈[-136,-1]mm · 2 of 25 slices shown (2 of 2)]
[im 1/25]
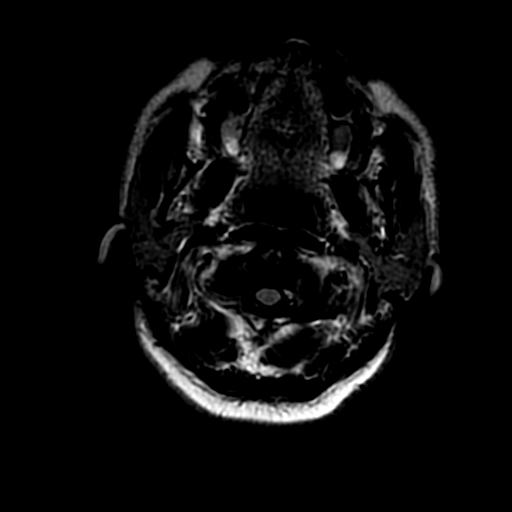
[im 25/25]
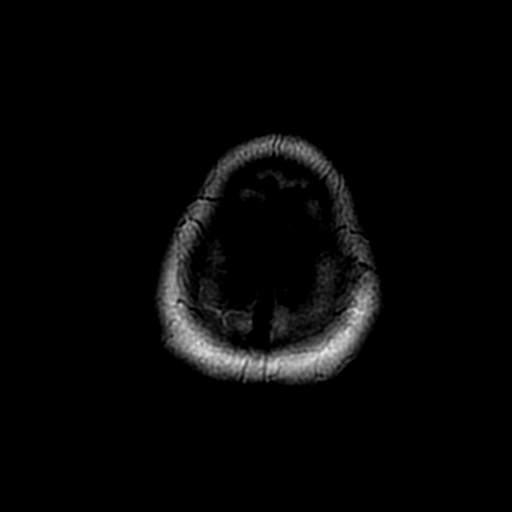

[Series 7: SWI · axial · 3.0mm · 0.47mm/px · z∈[-138,-59]mm · 5 of 100 slices shown]
[im 1/100]
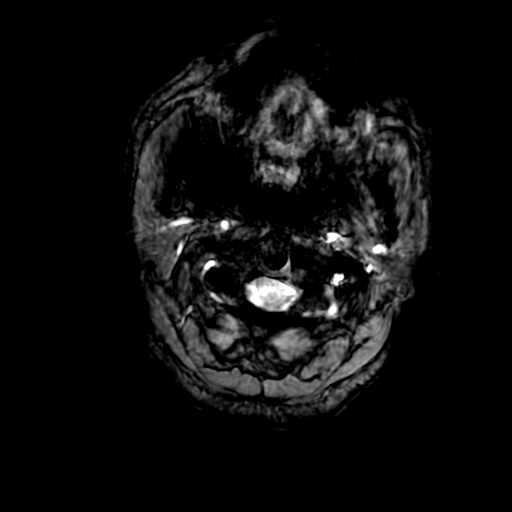
[im 15/100]
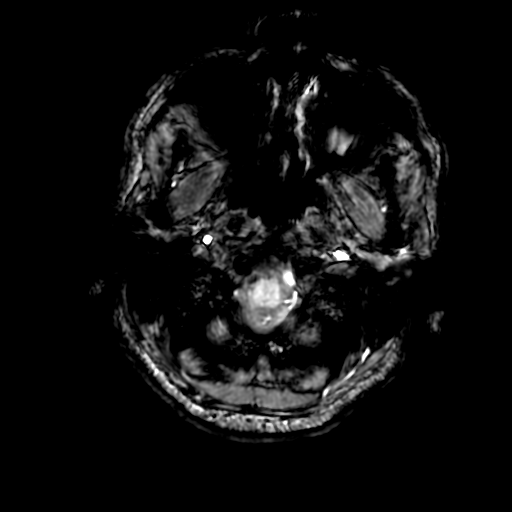
[im 29/100]
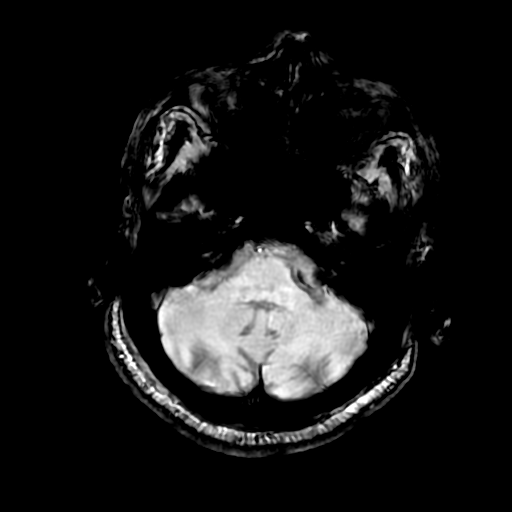
[im 43/100]
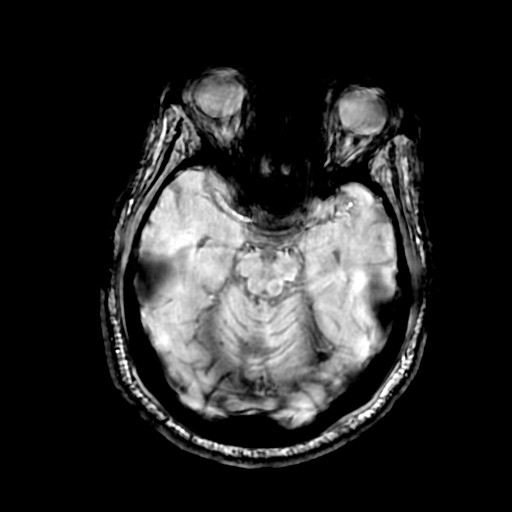
[im 57/100]
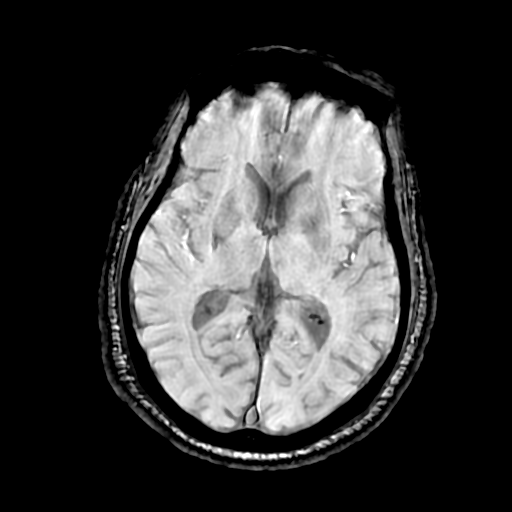

[Series 9: T2 · coronal · 5.0mm · 0.47mm/px · 2 of 30 slices shown (2 of 2)]
[im 1/30]
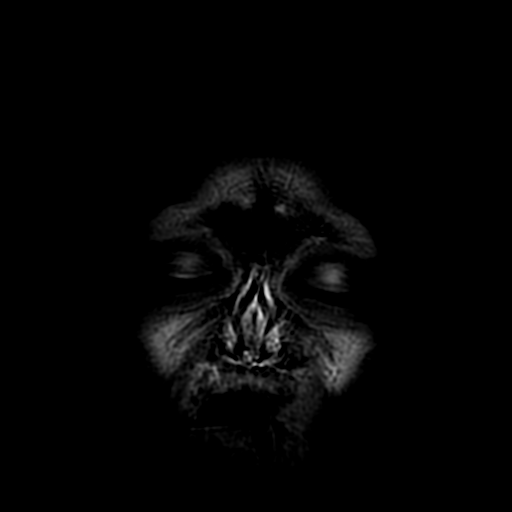
[im 30/30]
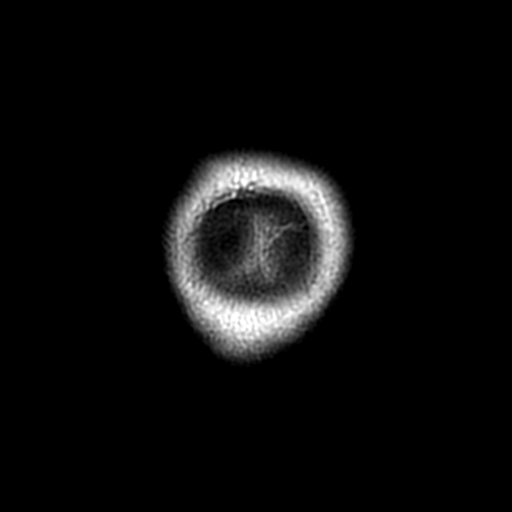

[Series 250: ADC · axial · 3.0mm · 0.94mm/px · z∈[-142,-4]mm · 4 of 49 slices shown (1 of 2)]
[im 1/49]
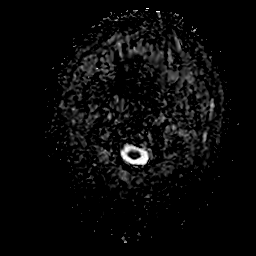
[im 17/49]
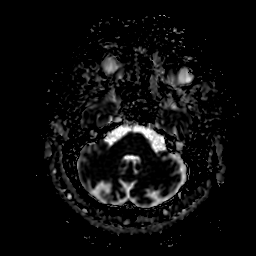
[im 33/49]
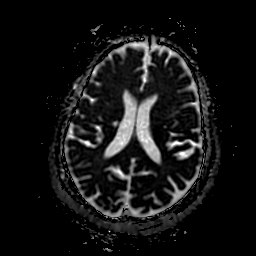
[im 49/49]
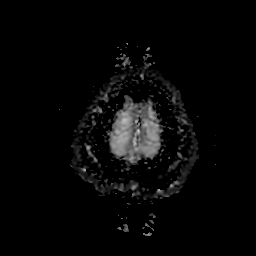

[Series 350: ADC · coronal · 4.0mm · 0.94mm/px · 3 of 36 slices shown (2 of 2)]
[im 1/36]
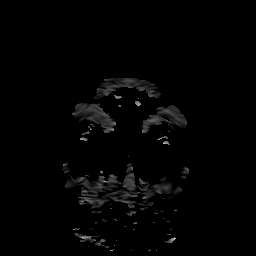
[im 18/36]
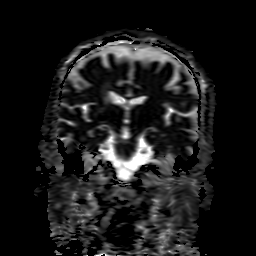
[im 36/36]
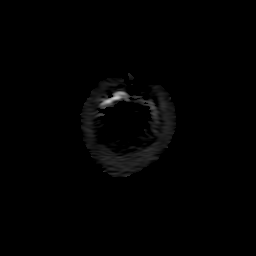

[33 of 48 positions shown; findings below may reference images not displayed]

FINDINGS: Brain: Diffusion imaging does not show any acute or subacute
infarction. There is cerebellar atrophy. Few old small vessel
cerebellar infarctions. No focal brainstem insult. Cerebral
hemispheres show generalized atrophy with an old infarction in the
radiating white matter tracts on the right. No large vessel
territory insult. No mass lesion, hemorrhage, hydrocephalus or
extra-axial collection.

Vascular: Major vessels at the base of the brain show flow.

Skull and upper cervical spine: Negative

Sinuses/Orbits: Clear/normal

Other: None
IMPRESSION: No acute finding by MRI. Generalized atrophy. Old small vessel
infarctions of the cerebellum and cerebral hemispheric white matter.

## 2021-10-20 ENCOUNTER — Emergency Department (HOSPITAL_COMMUNITY)
Admission: EM | Admit: 2021-10-20 | Discharge: 2021-10-20 | Disposition: A | Discharge status: Home / Self Care | Payer: Medicare (Managed Care) | Attending: Emergency Medicine | Admitting: Emergency Medicine

## 2021-10-20 ENCOUNTER — Other Ambulatory Visit: Payer: Self-pay

## 2021-10-20 ENCOUNTER — Encounter (HOSPITAL_COMMUNITY): Payer: Self-pay | Admitting: Emergency Medicine

## 2021-10-20 DIAGNOSIS — Z7982 Long term (current) use of aspirin: Secondary | ICD-10-CM | POA: Insufficient documentation

## 2021-10-20 DIAGNOSIS — Z7902 Long term (current) use of antithrombotics/antiplatelets: Secondary | ICD-10-CM | POA: Diagnosis not present

## 2021-10-20 DIAGNOSIS — R55 Syncope and collapse: Secondary | ICD-10-CM | POA: Diagnosis present

## 2021-10-20 LAB — CBC
HCT: 36.8 % — ABNORMAL LOW (ref 39.0–52.0)
Hemoglobin: 11.4 g/dL — ABNORMAL LOW (ref 13.0–17.0)
MCH: 28.5 pg (ref 26.0–34.0)
MCHC: 31 g/dL (ref 30.0–36.0)
MCV: 92 fL (ref 80.0–100.0)
Platelets: 216 10*3/uL (ref 150–400)
RBC: 4 MIL/uL — ABNORMAL LOW (ref 4.22–5.81)
RDW: 13.5 % (ref 11.5–15.5)
WBC: 4.7 10*3/uL (ref 4.0–10.5)
nRBC: 0 % (ref 0.0–0.2)

## 2021-10-20 LAB — BASIC METABOLIC PANEL
Anion gap: 5 (ref 5–15)
BUN: 16 mg/dL (ref 8–23)
CO2: 25 mmol/L (ref 22–32)
Calcium: 9 mg/dL (ref 8.9–10.3)
Chloride: 108 mmol/L (ref 98–111)
Creatinine, Ser: 1.15 mg/dL (ref 0.61–1.24)
GFR, Estimated: >60 mL/min (ref >60)
Glucose, Bld: 132 mg/dL — ABNORMAL HIGH (ref 70–99)
Potassium: 4 mmol/L (ref 3.5–5.1)
Sodium: 138 mmol/L (ref 135–145)

## 2021-10-20 LAB — URINALYSIS, ROUTINE W REFLEX MICROSCOPIC
Bilirubin Urine: NEGATIVE
Glucose, UA: NEGATIVE mg/dL
Hgb urine dipstick: NEGATIVE
Ketones, ur: NEGATIVE mg/dL
Leukocytes,Ua: NEGATIVE
Nitrite: NEGATIVE
Protein, ur: NEGATIVE mg/dL
Specific Gravity, Urine: 1.02 (ref 1.005–1.030)
pH: 5 (ref 5.0–8.0)

## 2021-10-20 MED ORDER — SODIUM CHLORIDE 0.9% FLUSH: 3.0000 mL | Freq: Once | INTRAVENOUS | Status: DC

## 2021-10-20 MED ORDER — SODIUM CHLORIDE 0.9 % IV BOLUS
1000.0000 mL | Freq: Once | INTRAVENOUS | Status: AC
  Administered 2021-10-20: 1000 mL via INTRAVENOUS

## 2021-10-20 NOTE — ED Triage Notes (Addendum)
Pt to triage via [facility] from laundry mat.  Pt had a witnessed syncopal episode and was taken outside.  Denies fall.  States he laid down.  Dizziness prior to syncopal event.  Denies dizziness now.  On fire arrival he was unresponsive with no radial pulses.  On EMS arrival he was responsive.  First degree block and diaphoretic. ?Denies pain or complaint. ? ?EMS- 20g L hand ?NS 350cc ?CBG 173 ? ?

## 2021-10-20 NOTE — Discharge Instructions (Signed)
It was our pleasure to provide your ER care today - we hope that you feel better. ? ?Drink plenty of fluids/stay well hydrated. Make sure to eat meals regularly/get adequate nutrition. ? ?Follow up closely with primary care doctor in the coming week. ? ?Return to ER if worse, new symptoms, fevers, new/severe pain, chest pain, trouble breathing, weak/fainting, or other concern.  ?

## 2021-10-20 NOTE — ED Notes (Signed)
The pt has no complaints except a little cold  blanket given to the pt ?

## 2021-10-20 NOTE — ED Provider Notes (Signed)
?[facility] EMERGENCY DEPARTMENT ?Provider Note ? ? ?CSN: [id number] ?Arrival date & time: 10/20/21  1439 ? ?  ? ?History ? ?Chief Complaint  ?Patient presents with  ? Loss of Consciousness  ? ? ?Randy Walter is a 76 y.o. male. ? ?Patient with episode of feeling faint/lightheaded earlier today. Was at laundry, standing, and felt generally weak/faint as if might pass out. He laid down, denies syncope. No associated chest pain or discomfort. No sob or unusual doe. Denies palpitations or sense of fast, slow, or irregular beating. No other recent similar events. No hx dysrhythmia or syncope. Denies headache. No abd pain or nv. No recent rectal bleeding or melena. No dysuria or gu c/o. No change in speech or vision. No numbness or weakness. No problems w gait or balance. Denies new meds or change in meds. EMS was called, CBG 173.  Pt indicates he had drank/eaten very little today.  ? ?The history is provided by the patient, a relative, medical records and the EMS personnel.  ?Loss of Consciousness ?Associated symptoms: no chest pain, no confusion, no fever, no headaches, no palpitations, no shortness of breath, no vomiting and no weakness   ? ?  ? ?Home Medications ?Prior to Admission medications   ?Medication Sig Start Date End Date Taking? Authorizing Provider  ?amLODipine (NORVASC) 10 MG tablet Take 10 mg by mouth daily. 02/26/19   [provider]  ?aspirin EC 81 MG tablet Take 1 tablet (81 mg total) by mouth daily. 12/05/13   Philip Aspen, Limmie Patricia, MD  ?atorvastatin (LIPITOR) 10 MG tablet Take 1 tablet (10 mg total) by mouth daily. 12/05/13   Philip Aspen, Limmie Patricia, MD  ?carvedilol (COREG) 12.5 MG tablet Take 12.5 mg by mouth 2 (two) times daily. 03/31/19   [provider]  ?clopidogrel (PLAVIX) 75 MG tablet TAKE 1 TABLET BY MOUTH ONCE DAILY WITH FOOD 01/31/19   [provider]  ?gabapentin (NEURONTIN) 300 MG capsule Take 1 capsule (300 mg total) by mouth 3 (three) times  daily. ?Patient not taking: Reported on 04/05/2019 05/02/18   Raeford Razor, MD  ?HYDROcodone-acetaminophen (NORCO/VICODIN) 5-325 MG tablet Take 1 tablet by mouth every 6 (six) hours as needed for moderate pain. ?Patient not taking: Reported on 04/05/2019 04/08/17   Manus Rudd, MD  ?lisinopril (PRINIVIL,ZESTRIL) 20 MG tablet Take 40 mg by mouth 2 (two) times daily.     [provider]  ?lisinopril (ZESTRIL) 40 MG tablet Take 40 mg by mouth 2 (two) times daily. 02/18/19   [provider]  ?nitroGLYCERIN (NITROSTAT) 0.4 MG SL tablet Place 1 tablet (0.4 mg total) under the tongue every 5 (five) minutes x 3 doses as needed for chest pain. 10/18/16   Rinaldo Cloud, MD  ?polyethylene glycol (MIRALAX / GLYCOLAX) packet Take 17 g by mouth 2 (two) times daily as needed for mild constipation or moderate constipation. ?Patient not taking: Reported on 04/05/2019 05/02/18   Raeford Razor, MD  ?traMADol (ULTRAM) 50 MG tablet Take 1 tablet (50 mg total) by mouth every 6 (six) hours as needed. ?Patient not taking: Reported on 04/05/2019 05/02/18   Raeford Razor, MD  ?   ? ?Allergies    ?Patient has no known allergies.   ? ?Review of Systems   ?Review of Systems  ?Constitutional:  Negative for fever.  ?HENT:  Negative for sore throat.   ?Eyes:  Negative for redness and visual disturbance.  ?Respiratory:  Negative for cough and shortness of breath.   ?  Cardiovascular:  Positive for syncope. Negative for chest pain, palpitations and leg swelling.  ?Gastrointestinal:  Negative for abdominal pain, blood in stool and vomiting.  ?Genitourinary:  Negative for dysuria and flank pain.  ?Musculoskeletal:  Negative for back pain and neck pain.  ?Skin:  Negative for rash.  ?Neurological:  Positive for light-headedness. Negative for speech difficulty, weakness, numbness and headaches.  ?Hematological:  Does not bruise/bleed easily.  ?Psychiatric/Behavioral:  Negative for confusion.   ? ?Physical Exam ?Updated Vital Signs ?BP  129/75   Pulse 69   Temp 98 ?F (36.7 ?C)   Resp 18   SpO2 100%  ?Physical Exam ?Vitals and nursing note reviewed.  ?Constitutional:   ?   Appearance: Normal appearance. He is well-developed.  ?HENT:  ?   Head: Atraumatic.  ?   Nose: Nose normal.  ?   Mouth/Throat:  ?   Mouth: Mucous membranes are moist.  ?   Pharynx: Oropharynx is clear.  ?Eyes:  ?   General: No scleral icterus. ?   Conjunctiva/sclera: Conjunctivae normal.  ?   Pupils: Pupils are equal, round, and reactive to light.  ?Neck:  ?   Vascular: No carotid bruit.  ?   Trachea: No tracheal deviation.  ?Cardiovascular:  ?   Rate and Rhythm: Normal rate and regular rhythm.  ?   Pulses: Normal pulses.  ?   Heart sounds: Normal heart sounds. No murmur heard. ?  No friction rub. No gallop.  ?Pulmonary:  ?   Effort: Pulmonary effort is normal. No accessory muscle usage or respiratory distress.  ?   Breath sounds: Normal breath sounds.  ?Abdominal:  ?   General: Bowel sounds are normal. There is no distension.  ?   Palpations: Abdomen is soft. There is no mass.  ?   Tenderness: There is no abdominal tenderness. There is no guarding.  ?Genitourinary: ?   Comments: No cva tenderness. ?Musculoskeletal:     ?   General: No swelling or tenderness.  ?   Cervical back: Normal range of motion and neck supple. No rigidity.  ?   Right lower leg: No edema.  ?   Left lower leg: No edema.  ?Skin: ?   General: Skin is warm and dry.  ?   Findings: No rash.  ?Neurological:  ?   Mental Status: He is alert.  ?   Comments: Alert, speech clear. Motor/sens grossly intact bil. Steady gait.   ?Psychiatric:     ?   Mood and Affect: Mood normal.  ? ? ?ED Results / Procedures / Treatments   ?Labs ?(all labs ordered are listed, but only abnormal results are displayed) ?Results for orders placed or performed during the hospital encounter of 10/20/21  ?Basic metabolic panel  ?Result Value Ref Range  ? Sodium 138 135 - 145 mmol/L  ? Potassium 4.0 3.5 - 5.1 mmol/L  ? Chloride 108 98 - 111  mmol/L  ? CO2 25 22 - 32 mmol/L  ? Glucose, Bld 132 (H) 70 - 99 mg/dL  ? BUN 16 8 - 23 mg/dL  ? Creatinine, Ser 1.15 0.61 - 1.24 mg/dL  ? Calcium 9.0 8.9 - 10.3 mg/dL  ? GFR, Estimated >60 >60 mL/min  ? Anion gap 5 5 - 15  ?CBC  ?Result Value Ref Range  ? WBC 4.7 4.0 - 10.5 K/uL  ? RBC 4.00 (L) 4.22 - 5.81 MIL/uL  ? Hemoglobin 11.4 (L) 13.0 - 17.0 g/dL  ? HCT 36.8 (L) 39.0 -  52.0 %  ? MCV 92.0 80.0 - 100.0 fL  ? MCH 28.5 26.0 - 34.0 pg  ? MCHC 31.0 30.0 - 36.0 g/dL  ? RDW 13.5 11.5 - 15.5 %  ? Platelets 216 150 - 400 K/uL  ? nRBC 0.0 0.0 - 0.2 %  ?Urinalysis, Routine w reflex microscopic Urine, Clean Catch  ?Result Value Ref Range  ? Color, Urine YELLOW YELLOW  ? APPearance CLEAR CLEAR  ? Specific Gravity, Urine 1.020 1.005 - 1.030  ? pH 5.0 5.0 - 8.0  ? Glucose, UA NEGATIVE NEGATIVE mg/dL  ? Hgb urine dipstick NEGATIVE NEGATIVE  ? Bilirubin Urine NEGATIVE NEGATIVE  ? Ketones, ur NEGATIVE NEGATIVE mg/dL  ? Protein, ur NEGATIVE NEGATIVE mg/dL  ? Nitrite NEGATIVE NEGATIVE  ? Leukocytes,Ua NEGATIVE NEGATIVE  ? ?EKG ?EKG Interpretation ? ?Date/Time:  Saturday October 20 2021 14:46:25 EDT ?Ventricular Rate:  66 ?PR Interval:  200 ?QRS Duration: 90 ?QT Interval:  418 ?QTC Calculation: 438 ?R Axis:   40 ?Text Interpretation: Normal sinus rhythm with 1st degree A-V block Normal ECG When compared with ECG of 27-Jan-2019 14:53, PREVIOUS ECG IS PRESENT Confirmed by Cathren Laine ([id number]) on 10/20/2021 3:23:07 PM ? ?Radiology ?No results found. ? ?Procedures ?Procedures  ? ? ?Medications Ordered in ED ?Medications  ?sodium chloride flush (NS) 0.9 % injection 3 mL (has no administration in time range)  ? ? ?ED Course/ Medical Decision Making/ A&P ?  ?                        ?Medical Decision Making ?Problems Addressed: ?Near syncope: acute illness or injury with systemic symptoms that poses a threat to life or bodily functions ? ?Amount and/or Complexity of Data Reviewed ?Independent Historian: EMS ?   Details: hx ?External Data  Reviewed: labs and notes. ?Labs: ordered. Decision-making details documented in ED Course. ?ECG/medicine tests: ordered and independent interpretation performed. Decision-making details documented in ED Course. ?

## 2021-10-20 NOTE — ED Notes (Signed)
No pain a and o x 4 

## 2021-10-20 NOTE — ED Notes (Signed)
Family at  the bedside  pt and family aware that a urine is needed ?

## 2022-04-07 ENCOUNTER — Emergency Department (HOSPITAL_COMMUNITY): Payer: Medicare (Managed Care)

## 2022-04-07 ENCOUNTER — Emergency Department (HOSPITAL_COMMUNITY)
Admission: EM | Admit: 2022-04-07 | Discharge: 2022-04-07 | Disposition: A | Discharge status: Home / Self Care | Payer: Medicare (Managed Care) | Attending: Emergency Medicine | Admitting: Emergency Medicine

## 2022-04-07 ENCOUNTER — Encounter (HOSPITAL_COMMUNITY): Payer: Self-pay

## 2022-04-07 ENCOUNTER — Other Ambulatory Visit: Payer: Self-pay

## 2022-04-07 DIAGNOSIS — K5900 Constipation, unspecified: Secondary | ICD-10-CM | POA: Diagnosis present

## 2022-04-07 DIAGNOSIS — Z7902 Long term (current) use of antithrombotics/antiplatelets: Secondary | ICD-10-CM | POA: Insufficient documentation

## 2022-04-07 DIAGNOSIS — J449 Chronic obstructive pulmonary disease, unspecified: Secondary | ICD-10-CM | POA: Insufficient documentation

## 2022-04-07 DIAGNOSIS — Z79899 Other long term (current) drug therapy: Secondary | ICD-10-CM | POA: Insufficient documentation

## 2022-04-07 DIAGNOSIS — R7989 Other specified abnormal findings of blood chemistry: Secondary | ICD-10-CM | POA: Diagnosis not present

## 2022-04-07 DIAGNOSIS — Z7982 Long term (current) use of aspirin: Secondary | ICD-10-CM | POA: Diagnosis not present

## 2022-04-07 DIAGNOSIS — I10 Essential (primary) hypertension: Secondary | ICD-10-CM | POA: Insufficient documentation

## 2022-04-07 LAB — CBC WITH DIFFERENTIAL/PLATELET
Abs Immature Granulocytes: 0.03 10*3/uL (ref 0.00–0.07)
Basophils Absolute: 0 10*3/uL (ref 0.0–0.1)
Basophils Relative: 0 %
Eosinophils Absolute: 0 10*3/uL (ref 0.0–0.5)
Eosinophils Relative: 0 %
HCT: 40.3 % (ref 39.0–52.0)
Hemoglobin: 12.3 g/dL — ABNORMAL LOW (ref 13.0–17.0)
Immature Granulocytes: 0 %
Lymphocytes Relative: 12 %
Lymphs Abs: 1 10*3/uL (ref 0.7–4.0)
MCH: 28 pg (ref 26.0–34.0)
MCHC: 30.5 g/dL (ref 30.0–36.0)
MCV: 91.8 fL (ref 80.0–100.0)
Monocytes Absolute: 0.5 10*3/uL (ref 0.1–1.0)
Monocytes Relative: 5 %
Neutro Abs: 6.9 10*3/uL (ref 1.7–7.7)
Neutrophils Relative %: 83 %
Platelets: 261 10*3/uL (ref 150–400)
RBC: 4.39 MIL/uL (ref 4.22–5.81)
RDW: 13.9 % (ref 11.5–15.5)
WBC: 8.4 10*3/uL (ref 4.0–10.5)
nRBC: 0 % (ref 0.0–0.2)

## 2022-04-07 LAB — URINALYSIS, ROUTINE W REFLEX MICROSCOPIC
Bilirubin Urine: NEGATIVE
Glucose, UA: NEGATIVE mg/dL
Hgb urine dipstick: NEGATIVE
Ketones, ur: NEGATIVE mg/dL
Nitrite: NEGATIVE
Protein, ur: NEGATIVE mg/dL
Specific Gravity, Urine: 1.027 (ref 1.005–1.030)
pH: 5 (ref 5.0–8.0)

## 2022-04-07 LAB — COMPREHENSIVE METABOLIC PANEL
ALT: 18 U/L (ref 0–44)
AST: 18 U/L (ref 15–41)
Albumin: 3.9 g/dL (ref 3.5–5.0)
Alkaline Phosphatase: 68 U/L (ref 38–126)
Anion gap: 6 (ref 5–15)
BUN: 25 mg/dL — ABNORMAL HIGH (ref 8–23)
CO2: 27 mmol/L (ref 22–32)
Calcium: 9.7 mg/dL (ref 8.9–10.3)
Chloride: 107 mmol/L (ref 98–111)
Creatinine, Ser: 1.25 mg/dL — ABNORMAL HIGH (ref 0.61–1.24)
GFR, Estimated: 60 mL/min — ABNORMAL LOW (ref >60)
Glucose, Bld: 144 mg/dL — ABNORMAL HIGH (ref 70–99)
Potassium: 4.2 mmol/L (ref 3.5–5.1)
Sodium: 140 mmol/L (ref 135–145)
Total Bilirubin: 0.5 mg/dL (ref 0.3–1.2)
Total Protein: 7.2 g/dL (ref 6.5–8.1)

## 2022-04-07 LAB — LIPASE, BLOOD: Lipase: 26 U/L (ref 11–51)

## 2022-04-07 LAB — MAGNESIUM: Magnesium: 2.2 mg/dL (ref 1.7–2.4)

## 2022-04-07 MED ORDER — IOHEXOL 300 MG/ML  SOLN
100.0000 mL | Freq: Once | INTRAMUSCULAR | Status: AC | PRN
  Administered 2022-04-07: 100 mL via INTRAVENOUS

## 2022-04-07 MED ORDER — SODIUM CHLORIDE (PF) 0.9 % IJ SOLN
INTRAMUSCULAR | Status: AC
  Filled 2022-04-07: qty 50

## 2022-04-07 NOTE — ED Provider Notes (Signed)
[facility] DEPT Provider Note   CSN: [id number] Arrival date & time: 04/07/22  0046     History  Chief Complaint  Patient presents with   Constipation    Randy Walter is a 76 y.o. male with past medical history of uncontrolled hypertension and COPD presenting today with constipation.  He reports that he has been constipated over the past week.  Passing very minimal stool and gas.  Last night he took a laxative at 7 PM and he reports having 4-5 bowel movements in the department, most recent one being 20 minutes ago.  Does say that he has a history of bowel obstruction.  Denies any opioid medications.  Had a hernia repair but denies other abdominal surgeries     Constipation Associated symptoms: no diarrhea, no dysuria, no nausea and no vomiting        Home Medications Prior to Admission medications   Medication Sig Start Date End Date Taking? Authorizing Provider  amLODipine (NORVASC) 10 MG tablet Take 10 mg by mouth daily. 02/26/19   [provider]  aspirin EC 81 MG tablet Take 1 tablet (81 mg total) by mouth daily. 12/05/13   Isaac Bliss, Rayford Halsted, MD  atorvastatin (LIPITOR) 10 MG tablet Take 1 tablet (10 mg total) by mouth daily. 12/05/13   Isaac Bliss, Rayford Halsted, MD  carvedilol (COREG) 12.5 MG tablet Take 12.5 mg by mouth 2 (two) times daily. 03/31/19   [provider]  clopidogrel (PLAVIX) 75 MG tablet TAKE 1 TABLET BY MOUTH ONCE DAILY WITH FOOD 01/31/19   [provider]  gabapentin (NEURONTIN) 300 MG capsule Take 1 capsule (300 mg total) by mouth 3 (three) times daily. Patient not taking: Reported on 04/05/2019 05/02/18   Virgel Manifold, MD  HYDROcodone-acetaminophen (NORCO/VICODIN) 5-325 MG tablet Take 1 tablet by mouth every 6 (six) hours as needed for moderate pain. Patient not taking: Reported on 04/05/2019 04/08/17   Donnie Mesa, MD  lisinopril (PRINIVIL,ZESTRIL) 20 MG tablet Take 40 mg by mouth 2 (two)  times daily.     [provider]  lisinopril (ZESTRIL) 40 MG tablet Take 40 mg by mouth 2 (two) times daily. 02/18/19   [provider]  nitroGLYCERIN (NITROSTAT) 0.4 MG SL tablet Place 1 tablet (0.4 mg total) under the tongue every 5 (five) minutes x 3 doses as needed for chest pain. 10/18/16   [name], MD  polyethylene glycol (MIRALAX / GLYCOLAX) packet Take 17 g by mouth 2 (two) times daily as needed for mild constipation or moderate constipation. Patient not taking: Reported on 04/05/2019 05/02/18   Virgel Manifold, MD  traMADol (ULTRAM) 50 MG tablet Take 1 tablet (50 mg total) by mouth every 6 (six) hours as needed. Patient not taking: Reported on 04/05/2019 05/02/18   Virgel Manifold, MD      Allergies    Patient has no known allergies.    Review of Systems   Review of Systems  Gastrointestinal:  Positive for constipation. Negative for diarrhea, nausea and vomiting.  Genitourinary:  Negative for dysuria and hematuria.    Physical Exam Updated Vital Signs BP (!) 141/59 (BP Location: Left Arm)   Pulse 75   Temp 98.1 F (36.7 C) (Oral)   Resp 18   Ht 6\' 1"  (1.854 m)   Wt 74.8 kg   SpO2 97%   BMI 21.77 kg/m  Physical Exam Vitals and nursing note reviewed.  Constitutional:      Appearance: Normal appearance.  HENT:  Head: Normocephalic and atraumatic.  Eyes:     General: No scleral icterus.    Conjunctiva/sclera: Conjunctivae normal.  Cardiovascular:     Rate and Rhythm: Normal rate and regular rhythm.  Pulmonary:     Effort: Pulmonary effort is normal. No respiratory distress.     Breath sounds: No wheezing or rales.  Abdominal:     General: There is distension.     Tenderness: There is no abdominal tenderness. There is no guarding.     Comments: Minimal bowel sounds, bowel sounds present and within normal limits in the right lower quadrant  Skin:    General: Skin is warm and dry.     Findings: No rash.  Neurological:     Mental Status: He is  alert.  Psychiatric:        Mood and Affect: Mood normal.     ED Results / Procedures / Treatments   Labs (all labs ordered are listed, but only abnormal results are displayed) Labs Reviewed  COMPREHENSIVE METABOLIC PANEL - Abnormal; Notable for the following components:      Result Value   Glucose, Bld 144 (*)    BUN 25 (*)    Creatinine, Ser 1.25 (*)    GFR, Estimated 60 (*)    All other components within normal limits  CBC WITH DIFFERENTIAL/PLATELET - Abnormal; Notable for the following components:   Hemoglobin 12.3 (*)    All other components within normal limits  LIPASE, BLOOD  URINALYSIS, ROUTINE W REFLEX MICROSCOPIC    EKG None  Radiology DG Abdomen 1 View  Result Date: 04/07/2022 CLINICAL DATA:  Constipation for 1 week. EXAM: ABDOMEN - 1 VIEW COMPARISON:  None Available. FINDINGS: Stool-filled colon with prominent stool in the left colon. Mildly dilated gas-filled small bowel. Changes likely to represent colonic constipation with ileus although early small bowel obstruction could have this appearance. No radiopaque stones. Degenerative changes in the spine. IMPRESSION: 1. Diffusely stool-filled colon without distention. 2. Gas-filled mildly distended small bowel likely representing ileus although early obstruction could possibly have this appearance. Electronically Signed   By: Burman Nieves M.D.   On: 04/07/2022 01:27    Procedures Procedures   Medications Ordered in ED Medications - No data to display  ED Course/ Medical Decision Making/ A&P Clinical Course as of 04/07/22 1014  Sun Apr 07, 2022  [id number] 76 year old male here with complaint of abdominal pain and constipation.  He said he took a laxative last evening without improvement.  No other complaints.  He has had a couple of bowel movements here already and CT not showing any acute findings.  Likely can be discharged to follow-up outpatient with his primary care doctor. [MB]    Clinical Course User  Index [MB] Terrilee Files, MD                           Medical Decision Making Amount and/or Complexity of Data Reviewed Labs: ordered. Radiology: ordered.  Risk Prescription drug management.   This is a 76 year old male presenting today with constipation.  Differential includes but is not limited to ileus, electrolyte abnormality, bowel obstruction, or diet.   This is not an exhaustive differential.    Past Medical History / Co-morbidities / Social History: Previous bowel obstruction   Additional history: Per PDMP review patient is not on any opioid medications.  Previously was on tramadol and hydrocodone but these have not been filled anytime recently.   Physical Exam:  Pertinent physical exam findings include Distended abdomen with minimally reactive bowel sounds.  Bowel sounds are normal in the right lower quadrant  Lab Tests: I ordered, and personally interpreted labs.  The pertinent results include: Creatinine 1.25, multiple elevated readings from 5 years ago.  5 months ago this was within normal limits. Normal electrolytes   Imaging Studies: X-ray ordered in triage.  I viewed and interpreted this and agree with radiology that there is a large amount of stool in patient's colon.  Radiology questions ileus versus SBO  I ordered, reviewed and interpreted patient's CT which was negative for obstruction.    Medications: Patient denied any pain or nausea.  Medications deferred at this time.  Patient also having spontaneous bowel movements    MDM/Disposition: This is a 76 year old male presenting today due to constipation.  Reports he has felt constipated for the past week.  When I first went to evaluate him he let me know that he had had 4-5 bowel movements since being in the department.  Due to his age and history of bowel obstruction I ordered CT to rule out obstruction.  This was negative.  Patient having bowel movements in the department and is stable for discharge  home.  He may use a laxative that likely helped him have these bowel movements.  Follow-up with PCP encouraged.   I discussed this case with my attending physician Dr. Charm Barges who cosigned this note including patient's presenting symptoms, physical exam, and planned diagnostics and interventions. Attending physician stated agreement with plan or made changes to plan which were implemented.      Final Clinical Impression(s) / ED Diagnoses Final diagnoses:  Constipation, unspecified constipation type    Rx / DC Orders ED Discharge Orders     None      Results and diagnoses were explained to the patient. Return precautions discussed in full. Patient had no additional questions and expressed complete understanding.   This chart was dictated using voice recognition software.  Despite best efforts to proofread,  errors can occur which can change the documentation meaning.     Woodroe Chen 04/07/22 1015    Terrilee Files, MD 04/07/22 1750

## 2022-04-07 NOTE — ED Triage Notes (Signed)
Pt BIB EMS with reports of constipation x 1 week. Pt took a laxative at 10 pm last night.

## 2022-04-07 NOTE — ED Notes (Signed)
Patient hasnt been able to produce a urine sample

## 2022-04-07 NOTE — Discharge Instructions (Addendum)
Your CT scan is normal today.  Please follow-up with your PCP with any more concerns about your constipation.  Be sure to stay hydrated to help prevent constipation and continue to use laxatives as needed.  Return with any worsening symptoms.

## 2022-04-07 NOTE — ED Notes (Signed)
The patient is currently in the triage bathroom having a bowel movement.

## 2023-04-16 ENCOUNTER — Other Ambulatory Visit (HOSPITAL_COMMUNITY): Payer: Self-pay | Admitting: Cardiology

## 2023-04-16 DIAGNOSIS — R079 Chest pain, unspecified: Secondary | ICD-10-CM

## 2023-04-25 ENCOUNTER — Encounter (HOSPITAL_COMMUNITY)
Admission: RE | Admit: 2023-04-25 | Discharge: 2023-04-25 | Disposition: A | Discharge status: Home / Self Care | Payer: Medicare (Managed Care) | Source: Ambulatory Visit | Attending: Cardiology | Admitting: Cardiology

## 2023-04-25 DIAGNOSIS — R079 Chest pain, unspecified: Secondary | ICD-10-CM | POA: Insufficient documentation

## 2023-04-25 MED ORDER — REGADENOSON 0.4 MG/5ML IV SOLN
INTRAVENOUS | Status: AC
  Filled 2023-04-25: qty 5

## 2023-04-25 MED ORDER — TECHNETIUM TC 99M TETROFOSMIN IV KIT
30.5000 | PACK | Freq: Once | INTRAVENOUS | Status: AC | PRN
  Administered 2023-04-25: 30.5 via INTRAVENOUS

## 2023-04-25 MED ORDER — REGADENOSON 0.4 MG/5ML IV SOLN
0.4000 mg | Freq: Once | INTRAVENOUS | Status: AC
  Administered 2023-04-25: 0.4 mg via INTRAVENOUS

## 2023-04-25 MED ORDER — TECHNETIUM TC 99M TETROFOSMIN IV KIT
9.1000 | PACK | Freq: Once | INTRAVENOUS | Status: AC | PRN
  Administered 2023-04-25: 9.1 via INTRAVENOUS

## 2023-12-19 ENCOUNTER — Emergency Department (HOSPITAL_COMMUNITY): Payer: Medicare (Managed Care)

## 2023-12-19 ENCOUNTER — Observation Stay (HOSPITAL_COMMUNITY)
Admission: EM | Admit: 2023-12-19 | Discharge: 2023-12-21 | Disposition: A | Discharge status: Home / Self Care | Payer: Medicare (Managed Care) | Attending: Internal Medicine | Admitting: Internal Medicine

## 2023-12-19 ENCOUNTER — Other Ambulatory Visit: Payer: Self-pay

## 2023-12-19 ENCOUNTER — Encounter (HOSPITAL_COMMUNITY): Payer: Self-pay | Admitting: Emergency Medicine

## 2023-12-19 DIAGNOSIS — F17210 Nicotine dependence, cigarettes, uncomplicated: Secondary | ICD-10-CM | POA: Diagnosis not present

## 2023-12-19 DIAGNOSIS — Z7902 Long term (current) use of antithrombotics/antiplatelets: Secondary | ICD-10-CM | POA: Diagnosis not present

## 2023-12-19 DIAGNOSIS — R569 Unspecified convulsions: Secondary | ICD-10-CM | POA: Diagnosis not present

## 2023-12-19 DIAGNOSIS — R479 Unspecified speech disturbances: Principal | ICD-10-CM

## 2023-12-19 DIAGNOSIS — J449 Chronic obstructive pulmonary disease, unspecified: Secondary | ICD-10-CM | POA: Diagnosis not present

## 2023-12-19 DIAGNOSIS — Z8673 Personal history of transient ischemic attack (TIA), and cerebral infarction without residual deficits: Secondary | ICD-10-CM | POA: Diagnosis not present

## 2023-12-19 DIAGNOSIS — D72819 Decreased white blood cell count, unspecified: Secondary | ICD-10-CM | POA: Diagnosis not present

## 2023-12-19 DIAGNOSIS — Z79899 Other long term (current) drug therapy: Secondary | ICD-10-CM | POA: Insufficient documentation

## 2023-12-19 DIAGNOSIS — Z87891 Personal history of nicotine dependence: Secondary | ICD-10-CM | POA: Diagnosis not present

## 2023-12-19 DIAGNOSIS — R253 Fasciculation: Secondary | ICD-10-CM

## 2023-12-19 DIAGNOSIS — G459 Transient cerebral ischemic attack, unspecified: Secondary | ICD-10-CM | POA: Diagnosis not present

## 2023-12-19 DIAGNOSIS — I10 Essential (primary) hypertension: Secondary | ICD-10-CM | POA: Diagnosis present

## 2023-12-19 DIAGNOSIS — Z7982 Long term (current) use of aspirin: Secondary | ICD-10-CM | POA: Insufficient documentation

## 2023-12-19 DIAGNOSIS — I2510 Atherosclerotic heart disease of native coronary artery without angina pectoris: Secondary | ICD-10-CM | POA: Insufficient documentation

## 2023-12-19 LAB — COMPREHENSIVE METABOLIC PANEL WITH GFR
ALT: 12 U/L (ref 0–44)
AST: 19 U/L (ref 15–41)
Albumin: 3.5 g/dL (ref 3.5–5.0)
Alkaline Phosphatase: 61 U/L (ref 38–126)
Anion gap: 10 (ref 5–15)
BUN: 18 mg/dL (ref 8–23)
CO2: 25 mmol/L (ref 22–32)
Calcium: 9.5 mg/dL (ref 8.9–10.3)
Chloride: 107 mmol/L (ref 98–111)
Creatinine, Ser: 1.09 mg/dL (ref 0.61–1.24)
GFR, Estimated: >60 mL/min (ref >60)
Glucose, Bld: 100 mg/dL — ABNORMAL HIGH (ref 70–99)
Potassium: 4.3 mmol/L (ref 3.5–5.1)
Sodium: 142 mmol/L (ref 135–145)
Total Bilirubin: 0.5 mg/dL (ref 0.0–1.2)
Total Protein: 6.7 g/dL (ref 6.5–8.1)

## 2023-12-19 LAB — CBC
HCT: 38.7 % — ABNORMAL LOW (ref 39.0–52.0)
Hemoglobin: 11.7 g/dL — ABNORMAL LOW (ref 13.0–17.0)
MCH: 27.9 pg (ref 26.0–34.0)
MCHC: 30.2 g/dL (ref 30.0–36.0)
MCV: 92.1 fL (ref 80.0–100.0)
Platelets: 248 10*3/uL (ref 150–400)
RBC: 4.2 MIL/uL — ABNORMAL LOW (ref 4.22–5.81)
RDW: 13.9 % (ref 11.5–15.5)
WBC: 3.9 10*3/uL — ABNORMAL LOW (ref 4.0–10.5)
nRBC: 0 % (ref 0.0–0.2)

## 2023-12-19 MED ORDER — GADOBUTROL 1 MMOL/ML IV SOLN
8.0000 mL | Freq: Once | INTRAVENOUS | Status: AC | PRN
  Administered 2023-12-19: 8 mL via INTRAVENOUS

## 2023-12-19 NOTE — Consult Note (Incomplete)
 NEUROLOGY CONSULT NOTE   Date of service: December 19, 2023 Patient Name: Randy Walter MRN:  [id number] DOB:  [birth year] Chief Complaint: facial twitching Requesting Provider: Tonya Fredrickson, MD  AI software was used for assistance in this note with consent of the patient  History of Present Illness  Randy Walter is a 78 y.o. 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 This is the first occurrence of such twitching, although he had a similar episode years ago of slurred speech without twitching.  He is currently taking multiple medications, including Plavix  or aspirin , but is unsure of the specific medications. He takes one medication twice a day and four others once a day, with assistance from a friend to manage his medication regimen. He does not use a pill pack and takes medications directly from the bottle.  He reports experiencing shortness of breath for the past couple of months, which occurs when he attempts to walk to the mailbox, necessitating a rest on the porch due to fatigue. He also mentions weakness in his left leg that comes and goes.  He smokes approximately two to three cigarettes a day, with a pack lasting over a week. He has attempted to quit smoking but finds it challenging, especially when feeling depressed.  Regarding his prior stroke history, in 12/2018 he had a TIA of 5 minutes of slurred speech/confusion with workup notable for progressive intracranial atherosclerotic disease, with negative MRI brain, recommendation was for aspirin  and Plavix  followed by Plavix  monotherapy. 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 Arthritis    it moves; knees, shoulders (10/17/2016)  . CAD (coronary artery disease)   . CHF (congestive heart failure) (HCC)    Randy Walter 10/17/2016  . COPD (chronic obstructive pulmonary disease) (HCC)    Randy Walter 10/17/2016  . Dyspnea    w/ exertion   . High cholesterol   . Hypertension   . Stroke (HCC)    Randy Walter 10/17/2016  . Tobacco use     Past Surgical History:  Procedure Laterality Date  . CORONARY ANGIOPLASTY WITH STENT PLACEMENT  2007   to mid LAD /notes 10/17/2016  . INGUINAL HERNIA REPAIR Right 04/08/2017   Procedure: RIGHT INGUINAL HERNIA REPAIR  WITH MESH;  Surgeon: Dareen Ebbing, MD;  Location: [facility] OR;  Service: General;  Laterality: Right;  . INSERTION OF MESH N/A 04/08/2017   Procedure: INSERTION OF MESH;  Surgeon: Dareen Ebbing, MD;  Location: [facility] OR;  Service: General;  Laterality: N/A;  . LEFT HEART CATH AND CORONARY ANGIOGRAPHY N/A 10/18/2016   Procedure: Left Heart Cath and Coronary Angiography;  Surgeon: Chapman Commodore, MD;  Location: [facility] INVASIVE CV LAB;  Service: Cardiovascular;  Laterality: N/A;  . UMBILICAL HERNIA REPAIR N/A 04/08/2017   Procedure: UMBILICAL HERNIA REPAIR WITH MESH;  Surgeon: Dareen Ebbing, MD;  Location: [facility] OR;  Service: General;  Laterality: N/A;    Family History: Family History  Problem Relation Age of Onset  . CAD Maternal Grandmother     Social History  reports that he has been smoking cigarettes. He has a 30 pack-year smoking history. He has quit using smokeless tobacco.   His smokeless tobacco use included snuff and chew. He reports current alcohol use. He reports that he does not use drugs.  No Known Allergies  Medications  No current facility-administered medications for this encounter.  Current Outpatient Medications:  .  amLODipine  (NORVASC ) 10 MG tablet, Take 10 mg by mouth daily., Disp: , Rfl:  .  aspirin  EC 81 MG tablet, Take 1 tablet (81 mg total) by mouth daily., Disp: , Rfl:  .  atorvastatin  (LIPITOR ) 10 MG tablet, Take 1 tablet (10 mg total) by mouth daily., Disp: 30 tablet, Rfl: 1 .  carvedilol  (COREG ) 12.5 MG tablet, Take 12.5 mg by mouth 2 (two) times daily., Disp: , Rfl:  .  clopidogrel  (PLAVIX ) 75 MG tablet, TAKE 1 TABLET BY MOUTH ONCE DAILY WITH FOOD, Disp: , Rfl:  .  gabapentin  (NEURONTIN ) 300 MG capsule, Take 1 capsule (300 mg total) by mouth 3 (three) times daily. (Patient not taking: Reported on 04/05/2019), Disp: 15 capsule, Rfl: 0 .  HYDROcodone -acetaminophen  (NORCO/VICODIN) 5-325 MG tablet, Take 1 tablet by mouth every 6 (six) hours as needed for moderate pain. (Patient not taking: Reported on 04/05/2019), Disp: 30 tablet, Rfl: 0 .  lisinopril  (PRINIVIL ,ZESTRIL ) 20 MG tablet, Take 40 mg by mouth 2 (two) times daily. , Disp: , Rfl:  .  lisinopril  (ZESTRIL ) 40 MG tablet, Take 40 mg by mouth 2 (two) times daily., Disp: , Rfl:  .  nitroGLYCERIN  (NITROSTAT ) 0.4 MG SL tablet, Place 1 tablet (0.4 mg total) under the tongue every 5 (five) minutes x 3 doses as needed for chest pain., Disp: 25 tablet, Rfl: 12 .  polyethylene glycol (MIRALAX  / GLYCOLAX ) packet, Take 17 g by mouth 2 (two) times daily as needed for mild constipation or moderate constipation. (Patient not taking: Reported on 04/05/2019), Disp: 14 each, Rfl: 0 .  traMADol  (ULTRAM ) 50 MG tablet, Take 1 tablet (50 mg total) by mouth every 6 (six) hours as needed. (Patient not taking: Reported on 04/05/2019), Disp: 12 tablet, Rfl: 0  Vitals   Vitals:   12/19/23 1956  BP: 129/77  Pulse: 77   Resp: 18  Temp: 98.3 F (36.8 C)  SpO2: 99%    There is no height or weight on file to calculate BMI.   Physical Exam   Constitutional: Appears well-developed and well-nourished.  Psych: Affect appropriate to situation, pleasant and cooperative Eyes: No scleral injection HENT: No oropharyngeal obstruction.  MSK: no major joint deformities.  Cardiovascular: Perfusing extremities well Respiratory: Effort normal, non-labored breathing GI: Soft.  No distension. There is no tenderness.  Skin: Warm dry and intact visible skin  Neurologic Examination   Mental Status: Patient is awake, alert, oriented to person, place, month, year, and situation.  Reports he is 1 year older than recorded age but notes this is because his birth certificate is incorrect and he was actually born in 1946 Patient is able to give a clear and coherent history. No signs of aphasia or neglect, other than very mild difficulty with naming low-frequency objects (could not name dial of the watch, too mild to score on NIH stroke scale) Cranial Nerves: II: Visual Fields are full.  Pupils are equal, round, and reactive to light.   III,IV, VI: EOMI without ptosis or diploplia.  Mildly saccadic pursuits V: Facial sensation is symmetric to light touch VII: Facial movement is symmetric.  VIII: hearing is intact to voice X: Uvula elevates symmetrically XI: Shoulder shrug is symmetric. XII: tongue is midline without atrophy or fasciculations.  Motor: 5/5 strength was present in all four extremities.  Sensory: Sensation is symmetric to light touch and temperature in the arms and legs. Deep Tendon Reflexes: 3+ and symmetric in the brachioradialis and patellae.  Cerebellar: Mild end reach tremor bilaterally on finger-nose but no ataxia.  Heel-to-shin intact bilaterally Gait:  Deferred   Labs/Imaging/Neurodiagnostic studies   CBC:  Recent Labs  Lab January 10, 2024 2058  WBC 3.9*  HGB 11.7*  HCT 38.7*  MCV 92.1  PLT  248   Leukopenia is new from 04/07/2022, at which time WBC was 8.4  Basic Metabolic Panel:  Lab Results  Component Value Date   NA 142 January 10, 2024   K 4.3 01/10/2024   CO2 25 01/10/24   GLUCOSE 100 (H) 2024/01/10   BUN 18 2024-01-10   CREATININE 1.09 01/10/2024   CALCIUM  9.5 Jan 10, 2024   GFRNONAA >60 01-10-24   GFRAA >60 01/27/2019   Lipid Panel:  Lab Results  Component Value Date   LDLCALC 56 04/05/2019   INR  Lab Results  Component Value Date   INR 1.0 01/27/2019   APTT  Lab Results  Component Value Date   APTT 32 01/27/2019   AED levels: No results found for: PHENYTOIN, ZONISAMIDE, LAMOTRIGINE, LEVETIRACETA  CT Head without contrast(Personally reviewed): ***  CT angio Head and Neck with contrast(Personally reviewed): ***  MR Angio head without contrast and Carotid Duplex BL(Personally reviewed): ***  MRI Brain(Personally reviewed): ***   MYOCARDIAL IMAGING WITH SPECT (REST AND PHARMACOLOGIC-STRESS) GATED LEFT VENTRICULAR WALL MOTION STUDY LEFT VENTRICULAR EJECTION FRACTION 1. No reversible ischemia. Potential small scar in the anterior inferior wall. 2. Normal left ventricular wall motion. 3. Left ventricular ejection fraction 57% 4. Non invasive risk stratification*: Low  Neurodiagnostics rEEG:  ***  ASSESSMENT   Randy Walter is a 78 y.o. male ***  RECOMMENDATIONS  # Brief episode of left facial twitching followed by facial droop - MRI brain w/ and w/o contrast  - ***  # New leukopenia - Management per ED/PCP ______________________________________________________________________    Signed, Ronnette Coke, MD [facility]

## 2023-12-19 NOTE — ED Triage Notes (Signed)
 BIB EMS sudden right sided facial droop that last 10 minutes. Previous stroke with no deficits. Pt ambulatory with EMS LKW 1840 lasted 10 minutes. Pt has no complaints family would like pt to have ct scan.  EMS Vitals 128/75 90 hr 96% ra Cbg 96

## 2023-12-19 NOTE — ED Provider Notes (Signed)
 [name] Provider Note   CSN: [id number] Arrival date & time: 12/19/23  1944     Patient presents with: Facial Droop   Randy Walter is a 78 y.o. male.  He is brought in by family after an episode of left side of his face twitching causing his mouth to turn to the right along with difficulty speaking that lasted 5 or 10 minutes.  Occured about 2 hours ago.  Completely back to baseline.  Not associated with any headache blurry vision double vision numbness or weakness anywhere else in his body.  He said he had a similar episode a few years ago and was told it was a mini stroke.  He said he had 1 prior episode of stroke a long time ago.  No residual deficits.  Has been eating and drinking well no recent illness.   The history is provided by the patient and a relative.  Cerebrovascular Accident This is a new problem. The current episode started 1 to 2 hours ago. The problem has been resolved. Pertinent negatives include no chest pain, no abdominal pain, no headaches and no shortness of breath. Nothing aggravates the symptoms. Nothing relieves the symptoms. He has tried rest for the symptoms. The treatment provided significant relief.       Prior to Admission medications   Medication Sig Start Date End Date Taking? Authorizing Provider  amLODipine  (NORVASC ) 10 MG tablet Take 10 mg by mouth daily. 02/26/19   [provider]  aspirin  EC 81 MG tablet Take 1 tablet (81 mg total) by mouth daily. 12/05/13   Theophilus Andrews, Tully GRADE, MD  atorvastatin  (LIPITOR ) 10 MG tablet Take 1 tablet (10 mg total) by mouth daily. 12/05/13   Theophilus Andrews, Tully GRADE, MD  carvedilol  (COREG ) 12.5 MG tablet Take 12.5 mg by mouth 2 (two) times daily. 03/31/19   [provider]  clopidogrel  (PLAVIX ) 75 MG tablet TAKE 1 TABLET BY MOUTH ONCE DAILY WITH FOOD 01/31/19   [provider]  gabapentin  (NEURONTIN ) 300 MG capsule Take 1 capsule (300 mg  total) by mouth 3 (three) times daily. Patient not taking: Reported on 04/05/2019 05/02/18   Loetta Senior, MD  HYDROcodone -acetaminophen  (NORCO/VICODIN) 5-325 MG tablet Take 1 tablet by mouth every 6 (six) hours as needed for moderate pain. Patient not taking: Reported on 04/05/2019 04/08/17   Belinda Cough, MD  lisinopril  (PRINIVIL ,ZESTRIL ) 20 MG tablet Take 40 mg by mouth 2 (two) times daily.     [provider]  lisinopril  (ZESTRIL ) 40 MG tablet Take 40 mg by mouth 2 (two) times daily. 02/18/19   [provider]  nitroGLYCERIN  (NITROSTAT ) 0.4 MG SL tablet Place 1 tablet (0.4 mg total) under the tongue every 5 (five) minutes x 3 doses as needed for chest pain. 10/18/16   Levern Hutching, MD  polyethylene glycol (MIRALAX  / GLYCOLAX ) packet Take 17 g by mouth 2 (two) times daily as needed for mild constipation or moderate constipation. Patient not taking: Reported on 04/05/2019 05/02/18   Loetta Senior, MD  traMADol  (ULTRAM ) 50 MG tablet Take 1 tablet (50 mg total) by mouth every 6 (six) hours as needed. Patient not taking: Reported on 04/05/2019 05/02/18   Loetta Senior, MD    Allergies: Patient has no known allergies.    Review of Systems  Constitutional:  Negative for fever.  Eyes:  Negative for visual disturbance.  Respiratory:  Negative for shortness of breath.   Cardiovascular:  Negative for chest  pain.  Gastrointestinal:  Negative for abdominal pain.  Neurological:  Positive for facial asymmetry and speech difficulty. Negative for weakness, numbness and headaches.    Updated Vital Signs BP 129/77   Pulse 77   Temp 98.3 F (36.8 C)   Resp 18   SpO2 99%   Physical Exam Vitals and nursing note reviewed.  Constitutional:      General: He is not in acute distress.    Appearance: Normal appearance. He is well-developed.  HENT:     Head: Normocephalic and atraumatic.   Eyes:     Conjunctiva/sclera: Conjunctivae normal.    Cardiovascular:     Rate and Rhythm:  Normal rate and regular rhythm.     Heart sounds: No murmur heard. Pulmonary:     Effort: Pulmonary effort is normal. No respiratory distress.     Breath sounds: Normal breath sounds.  Abdominal:     Palpations: Abdomen is soft.     Tenderness: There is no abdominal tenderness.   Musculoskeletal:        General: No swelling.     Cervical back: Neck supple.   Skin:    General: Skin is warm and dry.     Capillary Refill: Capillary refill takes less than 2 seconds.   Neurological:     General: No focal deficit present.     Mental Status: He is alert.     Cranial Nerves: No cranial nerve deficit.     Sensory: No sensory deficit.     Motor: No weakness.     (all labs ordered are listed, but only abnormal results are displayed) Labs Reviewed  CBC - Abnormal; Notable for the following components:      Result Value   WBC 3.9 (*)    RBC 4.20 (*)    Hemoglobin 11.7 (*)    HCT 38.7 (*)    All other components within normal limits  COMPREHENSIVE METABOLIC PANEL WITH GFR - Abnormal; Notable for the following components:   Glucose, Bld 100 (*)    All other components within normal limits  URINALYSIS, ROUTINE W REFLEX MICROSCOPIC - Abnormal; Notable for the following components:   Color, Urine STRAW (*)    All other components within normal limits  HEMOGLOBIN A1C - Abnormal; Notable for the following components:   Hgb A1c MFr Bld 6.1 (*)    All other components within normal limits  CBC - Abnormal; Notable for the following components:   RBC 3.98 (*)    Hemoglobin 11.2 (*)    HCT 36.5 (*)    All other components within normal limits  RAPID URINE DRUG SCREEN, HOSP PERFORMED  LIPID PANEL  CREATININE, SERUM    EKG: EKG Interpretation Date/Time:  Friday December 19 2023 21:24:00 EDT Ventricular Rate:  72 PR Interval:  239 QRS Duration:  95 QT Interval:  394 QTC Calculation: 432 R Axis:   -2  Text Interpretation: Sinus rhythm Prolonged PR interval No significant change since  prior 4/23 Confirmed by Towana Sharper [phone]) on 12/19/2023 9:33:35 PM  Radiology: CT ANGIO HEAD NECK W WO CM Result Date: 12/20/2023 EXAM: CT HEAD WITHOUT CTA HEAD AND NECK WITH AND WITHOUT 12/20/2023 05:15:46 AM TECHNIQUE: CTA of the head and neck was performed with and without the administration of intravenous contrast. Noncontrast CT of the head with reconstructed 2-D images are also provided for review. Multiplanar 2D and/or 3D reformatted images are provided for review. Automated exposure control, iterative reconstruction, and/or weight based adjustment of the mA/kV  was utilized to reduce the radiation dose to as low as reasonably achievable. COMPARISON: CT angio head and neck 01/27/2019. MR head without and with contrast 12/19/2023. CLINICAL HISTORY: Stroke/TIA, determine embolic source. 75ml omni 350 IV. BIB EMS sudden right sided facial droop that last 10 minutes. Previous stroke with no deficits. Pt ambulatory with EMS; LKW 1840 lasted 10 minutes. FINDINGS: CT HEAD: BRAIN AND VENTRICLES: No acute intracranial hemorrhage. No mass effect or midline shift. No extra-axial fluid collection. Gray-white differentiation is maintained. No hydrocephalus. ORBITS: No acute abnormality. SINUSES: No acute abnormality. SOFT TISSUES AND SKULL: No acute abnormality. CTA NECK: AORTIC ARCH AND ARCH VESSELS: Atherosclerotic changes are present at the distal aortic arch and origin of the left subclavian artery without focal stenosis. The origins of the dominant vertebral artery and left common carotid artery are not imaged. CERVICAL CAROTID ARTERIES: Atherosclerotic changes are present at the right carotid bifurcation. A high-grade proximal stenosis is stable. A tandem 50% stenosis is stable. The more distal cervical right internal carotid artery is decreased in size compared to the left, stable. Atherosclerotic changes are present at the left carotid bifurcation. A 50% stenosis is present relative to the more distal  vessel. CERVICAL VERTEBRAL ARTERIES: The left vertebral artery is the dominant vessel. It originates from the subclavian artery without focal stenosis. The right vertebral artery is occluded proximally and reconstituted at the v3 segment via muscular branches. VISUALIZED LUNGS AND MEDIASTINUM: Centrilobular emphysema changes are present. No nodule or mass lesion is present. No effusion or pneumothorax is present. SOFT TISSUES: No acute abnormality. BONES: Multilevel degenerative changes are again noted within the cervical spine. Dental caries are present within residual teeth. CTA HEAD: ANTERIOR CIRCULATION: Dense atherosclerotic calcifications are present within the cavernous internal carotid arteries bilaterally. 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 The ICA terminali are within normal limits bilaterally. The left a1 segment is dominant. The ACA and MCA branch vessels are normal bilaterally. No aneurysm is present. POSTERIOR CIRCULATION: The right vertebral artery terminates at the PICA. Left PICA origin is visualized and normal. Irregularity is present in the distal basilar artery with a 50% stenosis. The superior cerebellar arteries are patent bilaterally. Two posterior cerebral arteries originate from the basilar tip. A prominent right posterior communicating artery contributes. Moderate atherosclerosis, stable. OTHER: No dural venous sinus thrombosis on this non-dedicated study. IMPRESSION: 1. No acute intracranial hemorrhage or other acute abnormality. 2. Stable severe cavernous ICA stenoses bilaterally. 3. Stable high-grade proximal and tandem 50% stenosis at the right carotid bifurcation. 4. 50% stenosis at the left carotid bifurcation. 5. Irregularity in the distal basilar artery with a 50% stenosis. Electronically signed by: Lonni Necessary MD 12/20/2023 05:37 AM EDT RP Workstation: [id number]   MR Brain W and Wo Contrast Result Date: 12/20/2023 CLINICAL DATA:  Transient ischemic  attack EXAM: MRI HEAD WITHOUT AND WITH CONTRAST TECHNIQUE: Multiplanar, multiecho pulse sequences of the brain and surrounding structures were obtained without and with intravenous contrast. CONTRAST:  8mL GADAVIST  GADOBUTROL  1 MMOL/ML IV SOLN COMPARISON:  01/27/2019 FINDINGS: Brain: No acute infarct, mass effect or extra-axial collection. No acute or chronic hemorrhage. Old right basal ganglia small vessel infarct. Normal CSF spaces. Old cerebellar small vessel infarcts. The midline structures are normal. There is no abnormal contrast enhancement. Vascular: Normal flow voids. Skull and upper cervical spine: Normal calvarium and skull base. Visualized upper cervical spine and soft tissues are normal. Sinuses/Orbits:Left maxillary sinus retention cyst. IMPRESSION: 1. No acute intracranial abnormality. 2. Old right basal ganglia and  cerebellar small vessel infarcts. Electronically Signed   By: Franky Stanford M.D.   On: 12/20/2023 00:02     Procedures   Medications Ordered in the ED  gadobutrol  (GADAVIST ) 1 MMOL/ML injection 8 mL (8 mLs Intravenous Contrast Given 12/19/23 2335)    Clinical Course as of 12/19/23 2349  Fri Dec 19, 2023  2111 Consulted neuro Dr. Jerrie.  She said to try to get an MRI with and without and she will be by to see the patient for further recommendations. [MB]    Clinical Course User Index [MB] Towana Ozell BROCKS, MD                                 Medical Decision Making Amount and/or Complexity of Data Reviewed Labs: ordered. Radiology: ordered.  Risk Prescription drug management. Decision regarding hospitalization.   This patient complains of facial twitching or droop, difficulty speaking; this involves an extensive number of treatment Options and is a complaint that carries with it a high risk of complications and morbidity. The differential includes TIA, stroke, seizure, hypoglycemia, metabolic derangement  I ordered, reviewed and interpreted labs, which  included CBC with stable low hemoglobin, chemistries unremarkable, talk screen negative, urinalysis without clear signs of infection  I ordered imaging studies which included MRI with and without brain and I independently    visualized and interpreted imaging which showed no acute stroke, does show 2 old strokes Additional history obtained from patient's wife and daughter Previous records obtained and reviewed in epic including his prior neurologic workups I consulted Dr. Jerrie neurology and discussed lab and imaging findings and discussed disposition.  Cardiac monitoring reviewed, sinus rhythm Social determinants considered, tobacco use Critical Interventions: None  After the interventions stated above, I reevaluated the patient and found patient currently asymptomatic and neuro intact Admission and further testing considered, neurology is recommending admission for further workup.  His care is signed out to Dr. Geroldine to talk with the hospitalist regarding admission.  Patient agreement with plan for admission.      Final diagnoses:  Speech disturbance, unspecified type    ED Discharge Orders     None          Towana Ozell BROCKS, MD 12/20/23 [phone]

## 2023-12-19 NOTE — Consult Note (Addendum)
 NEUROLOGY CONSULT NOTE   Date of service: December 19, 2023 Patient Name: Randy Walter MRN:  [id number] DOB:  [birth year] Chief Complaint: facial twitching Requesting Provider: Towana Ozell BROCKS, MD  AI software was used for assistance in this note with consent of the patient  History of Present Illness  Randy Walter is a 78 y.o. 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 This is the first occurrence of such twitching, although he had a similar episode years ago of slurred speech without twitching.  He is currently taking multiple medications, including Plavix  or aspirin , but is unsure of the specific medications. He takes one medication twice a day and four others once a day, with assistance from a friend to manage his medication regimen. He does not use a pill pack and takes medications directly from the bottle.  He reports experiencing shortness of breath for the past couple of months, which occurs when he attempts to walk to the mailbox, necessitating a rest on the porch due to fatigue. He also mentions weakness in his left leg that comes and goes.  He smokes approximately two to three cigarettes a day, with a pack lasting over a week. He has attempted to quit smoking but finds it challenging, especially when feeling depressed.  Regarding his prior stroke history, in 12/2018 he had a TIA of 5 minutes of slurred speech/confusion with workup notable for progressive intracranial atherosclerotic disease, with negative MRI brain, recommendation was for aspirin  and Plavix  followed by Plavix  monotherapy.  Echocardiogram was recommended to be completed outpatient but he did not get the study done.  In  had a 2007 right frontoparietal periventricular white matter infarct with no significant residual (reports intermittent mild left leg weakness when tired)  LKW: 1840 Modified rankin score: 1-No significant post stroke disability and can perform usual duties with stroke symptoms (intermittent slight left leg weakness when tired) IV Thrombolysis: No, symptoms resolved EVT: No, exam not consistent with LVO NIHSS: 0   ROS  Comprehensive ROS performed and pertinent positives documented in HPI    Past History   Past Medical History:  Diagnosis Date   Arthritis    it moves; knees, shoulders (10/17/2016)   CAD (coronary artery disease)    CHF (congestive heart failure) (HCC)    thelbert 10/17/2016   COPD (chronic obstructive pulmonary disease) (HCC)    thelbert 10/17/2016   Dyspnea    w/ exertion    High cholesterol    Hypertension    Stroke (HCC)    thelbert 10/17/2016   Tobacco use     Past Surgical History:  Procedure Laterality Date   CORONARY ANGIOPLASTY WITH STENT PLACEMENT  2007   to mid LAD /notes 10/17/2016   INGUINAL HERNIA REPAIR Right 04/08/2017   Procedure: RIGHT INGUINAL HERNIA REPAIR  WITH MESH;  Surgeon: Belinda Cough, MD;  Location: [facility] OR;  Service: General;  Laterality: Right;   INSERTION OF MESH N/A 04/08/2017   Procedure: INSERTION OF MESH;  Surgeon: Belinda Cough, MD;  Location: [facility] OR;  Service: General;  Laterality: N/A;   LEFT HEART CATH AND CORONARY ANGIOGRAPHY N/A 10/18/2016   Procedure: Left Heart Cath and Coronary Angiography;  Surgeon: Rober Chroman, MD;  Location: [facility] INVASIVE CV LAB;  Service: Cardiovascular;  Laterality:  N/A;   UMBILICAL HERNIA REPAIR N/A 04/08/2017   Procedure: UMBILICAL HERNIA REPAIR WITH MESH;  Surgeon: Belinda Cough, MD;  Location: [facility] OR;  Service: General;  Laterality: N/A;    Family History: Family History  Problem Relation Age of Onset   CAD Maternal Grandmother     Social History  reports that he has been smoking cigarettes. He  has a 30 pack-year smoking history. He has quit using smokeless tobacco.  His smokeless tobacco use included snuff and chew. He reports current alcohol use. He reports that he does not use drugs.  No Known Allergies  Medications  No current facility-administered medications for this encounter.  Current Outpatient Medications:    amLODipine  (NORVASC ) 10 MG tablet, Take 10 mg by mouth daily., Disp: , Rfl:    aspirin  EC 81 MG tablet, Take 1 tablet (81 mg total) by mouth daily., Disp: , Rfl:    atorvastatin  (LIPITOR ) 10 MG tablet, Take 1 tablet (10 mg total) by mouth daily., Disp: 30 tablet, Rfl: 1   carvedilol  (COREG ) 12.5 MG tablet, Take 12.5 mg by mouth 2 (two) times daily., Disp: , Rfl:    clopidogrel  (PLAVIX ) 75 MG tablet, TAKE 1 TABLET BY MOUTH ONCE DAILY WITH FOOD, Disp: , Rfl:    gabapentin  (NEURONTIN ) 300 MG capsule, Take 1 capsule (300 mg total) by mouth 3 (three) times daily. (Patient not taking: Reported on 04/05/2019), Disp: 15 capsule, Rfl: 0   HYDROcodone -acetaminophen  (NORCO/VICODIN) 5-325 MG tablet, Take 1 tablet by mouth every 6 (six) hours as needed for moderate pain. (Patient not taking: Reported on 04/05/2019), Disp: 30 tablet, Rfl: 0   lisinopril  (PRINIVIL ,ZESTRIL ) 20 MG tablet, Take 40 mg by mouth 2 (two) times daily. , Disp: , Rfl:    lisinopril  (ZESTRIL ) 40 MG tablet, Take 40 mg by mouth 2 (two) times daily., Disp: , Rfl:    nitroGLYCERIN  (NITROSTAT ) 0.4 MG SL tablet, Place 1 tablet (0.4 mg total) under the tongue every 5 (five) minutes x 3 doses as needed for chest pain., Disp: 25 tablet, Rfl: 12   polyethylene glycol (MIRALAX  / GLYCOLAX ) packet, Take 17 g by mouth 2 (two) times daily as needed for mild constipation or moderate constipation. (Patient not taking: Reported on 04/05/2019), Disp: 14 each, Rfl: 0   traMADol  (ULTRAM ) 50 MG tablet, Take 1 tablet (50 mg total) by mouth every 6 (six) hours as needed. (Patient not taking: Reported on 04/05/2019), Disp: 12 tablet, Rfl:  0  Vitals   Vitals:   12/19/23 1956  BP: 129/77  Pulse: 77  Resp: 18  Temp: 98.3 F (36.8 C)  SpO2: 99%    There is no height or weight on file to calculate BMI.   Physical Exam   Constitutional: Appears well-developed and well-nourished.  Psych: Affect appropriate to situation, pleasant and cooperative Eyes: No scleral injection HENT: No oropharyngeal obstruction.  MSK: no major joint deformities.  Cardiovascular: Perfusing extremities well Respiratory: Effort normal, non-labored breathing GI: Soft.  No distension. There is no tenderness.  Skin: Warm dry and intact visible skin  Neurologic Examination   Mental Status: Patient is awake, alert, oriented to person, place, month, year, and situation.  Reports he is 1 year older than recorded age but notes this is because his birth certificate is incorrect and he was actually born in 1946 Patient is able to give a clear and coherent history. No signs of aphasia or neglect, other than very mild difficulty with naming low-frequency objects (could not name dial of  the watch, too mild to score on NIH stroke scale) Cranial Nerves: II: Visual Fields are full. Pupils are equal, round, and reactive to light.   III,IV, VI: EOMI without ptosis or diploplia.  Mildly saccadic pursuits V: Facial sensation is symmetric to light touch VII: Facial movement is symmetric.  VIII: hearing is intact to voice X: Uvula elevates symmetrically XI: Shoulder shrug is symmetric. XII: tongue is midline without atrophy or fasciculations.  Motor: 5/5 strength was present in all four extremities.  Sensory: Sensation is symmetric to light touch and temperature in the arms and legs. Deep Tendon Reflexes: 3+ and symmetric in the brachioradialis and patellae.  Cerebellar: Mild end reach tremor bilaterally on finger-nose but no ataxia.  Heel-to-shin intact bilaterally Gait:  Deferred   Labs/Imaging/Neurodiagnostic studies   CBC:  Recent Labs  Lab  12/19/23 2058  WBC 3.9*  HGB 11.7*  HCT 38.7*  MCV 92.1  PLT 248   Leukopenia is new from 04/07/2022, at which time WBC was 8.4  Basic Metabolic Panel:  Lab Results  Component Value Date   NA 142 12/19/2023   K 4.3 12/19/2023   CO2 25 12/19/2023   GLUCOSE 100 (H) 12/19/2023   BUN 18 12/19/2023   CREATININE 1.09 12/19/2023   CALCIUM  9.5 12/19/2023   GFRNONAA >60 12/19/2023   GFRAA >60 01/27/2019   Lipid Panel:  Lab Results  Component Value Date   CHOL 117 04/05/2019   HDL 43.20 04/05/2019   LDLCALC 56 04/05/2019   TRIG 91.0 04/05/2019   CHOLHDL 3 04/05/2019   No results found for: HGBA1C   INR  Lab Results  Component Value Date   INR 1.0 01/27/2019   APTT  Lab Results  Component Value Date   APTT 32 01/27/2019   AED levels: No results found for: PHENYTOIN, ZONISAMIDE, LAMOTRIGINE, LEVETIRACETA  MRI Brain(Personally reviewed): 1. No acute intracranial abnormality. 2. Old right basal ganglia and cerebellar small vessel infarcts.   ASSESSMENT   KYL GIVLER is a 78 y.o. male presenting with 10 minutes of face twitching (described as the left face going towards the right), followed by a brief period of facial droop.  Twitching can sometimes be seen in the setting of acute ischemia (limb shaking TIA has been in the literature with facial muscles as well); I favor this is the etiology based on his significant intracranial atherosclerotic disease seen on prior CTAs.  Less likely to be focal seizure in the setting of any structural lesion but will also get a routine EEG.  I do favor admission to get the full stroke workup completed including echocardiogram as he is complaining of worsening shortness of breath and did not get his ECHO completed in 2020 when he was planned for outpatient TIA workup; has not had an echo since 2016 and has significant cardiovascular risk factors  RECOMMENDATIONS  # Brief episode of left facial twitching followed by facial droop,  TIA versus focal seizure - MRI brain w/ and w/o contrast completed on my recommendation, negative as above - Stroke labs HgbA1c, fasting lipid panel - CTA head and neck - Frequent neuro checks per protocol - Echocardiogram - Aspirin  81 mg daily with Plavix  75 mg daily, course to be determined based on vessel imaging - Risk factor modification - Telemetry monitoring - Blood pressure goal   - Normotension given negative MRI brain and resolution of symptoms - Routine EEG - PT consult, OT consult, Speech consult, not indicated as patient is back to baseline -  Stroke team to follow  # New leukopenia - Management per ED/PCP  # Tobacco abuse, 3 minutes spent in counseling patient Current smoker, approximately 2-3 cigarettes per day. Difficulty quitting due to depression but interested in quitting. Discussed potential use of medications like varenicline or bupropion to aid in smoking cessation and address depression.  Patient reports he will follow-up with his PCP for consideration of using medications to help him stop smoking.  [place] quit line was also discussed ______________________________________________________________________   Lola Jernigan MD-PhD [facility] [phone] Available 7 AM to 7 PM, outside these hours please contact Neurologist on call listed on AMION

## 2023-12-20 ENCOUNTER — Observation Stay (HOSPITAL_COMMUNITY): Payer: Medicare (Managed Care)

## 2023-12-20 DIAGNOSIS — G4089 Other seizures: Secondary | ICD-10-CM

## 2023-12-20 DIAGNOSIS — F17210 Nicotine dependence, cigarettes, uncomplicated: Secondary | ICD-10-CM

## 2023-12-20 DIAGNOSIS — E785 Hyperlipidemia, unspecified: Secondary | ICD-10-CM

## 2023-12-20 DIAGNOSIS — I779 Disorder of arteries and arterioles, unspecified: Secondary | ICD-10-CM | POA: Diagnosis not present

## 2023-12-20 DIAGNOSIS — G459 Transient cerebral ischemic attack, unspecified: Secondary | ICD-10-CM | POA: Diagnosis not present

## 2023-12-20 LAB — LIPID PANEL
Cholesterol: 122 mg/dL (ref 0–200)
HDL: 54 mg/dL (ref >40)
LDL Cholesterol: 60 mg/dL (ref 0–99)
Total CHOL/HDL Ratio: 2.3 ratio
Triglycerides: 42 mg/dL (ref <150)
VLDL: 8 mg/dL (ref 0–40)

## 2023-12-20 LAB — CBC
HCT: 36.5 % — ABNORMAL LOW (ref 39.0–52.0)
Hemoglobin: 11.2 g/dL — ABNORMAL LOW (ref 13.0–17.0)
MCH: 28.1 pg (ref 26.0–34.0)
MCHC: 30.7 g/dL (ref 30.0–36.0)
MCV: 91.7 fL (ref 80.0–100.0)
Platelets: 215 10*3/uL (ref 150–400)
RBC: 3.98 MIL/uL — ABNORMAL LOW (ref 4.22–5.81)
RDW: 13.7 % (ref 11.5–15.5)
WBC: 4.2 10*3/uL (ref 4.0–10.5)
nRBC: 0 % (ref 0.0–0.2)

## 2023-12-20 LAB — URINALYSIS, ROUTINE W REFLEX MICROSCOPIC
Bilirubin Urine: NEGATIVE
Glucose, UA: NEGATIVE mg/dL
Hgb urine dipstick: NEGATIVE
Ketones, ur: NEGATIVE mg/dL
Leukocytes,Ua: NEGATIVE
Nitrite: NEGATIVE
Protein, ur: NEGATIVE mg/dL
Specific Gravity, Urine: 1.017 (ref 1.005–1.030)
pH: 7 (ref 5.0–8.0)

## 2023-12-20 LAB — RAPID URINE DRUG SCREEN, HOSP PERFORMED
Amphetamines: NOT DETECTED
Barbiturates: NOT DETECTED
Benzodiazepines: NOT DETECTED
Cocaine: NOT DETECTED
Opiates: NOT DETECTED
Tetrahydrocannabinol: NOT DETECTED

## 2023-12-20 LAB — CREATININE, SERUM
Creatinine, Ser: 0.84 mg/dL (ref 0.61–1.24)
GFR, Estimated: >60 mL/min (ref >60)

## 2023-12-20 LAB — HEMOGLOBIN A1C
Hgb A1c MFr Bld: 6.1 % — ABNORMAL HIGH (ref 4.8–5.6)
Mean Plasma Glucose: 128.37 mg/dL

## 2023-12-20 MED ORDER — STROKE: EARLY STAGES OF RECOVERY BOOK
Freq: Once | Status: AC
  Filled 2023-12-20: qty 1

## 2023-12-20 MED ORDER — ACETAMINOPHEN 160 MG/5ML PO SOLN: 650.0000 mg | ORAL | Status: DC | PRN

## 2023-12-20 MED ORDER — ASPIRIN 81 MG PO TBEC
81.0000 mg | DELAYED_RELEASE_TABLET | Freq: Every day | ORAL | Status: DC
  Administered 2023-12-20 – 2023-12-21 (×2): 81 mg via ORAL
  Filled 2023-12-20 (×2): qty 1

## 2023-12-20 MED ORDER — IOHEXOL 350 MG/ML SOLN
75.0000 mL | Freq: Once | INTRAVENOUS | Status: AC | PRN
  Administered 2023-12-20: 75 mL via INTRAVENOUS

## 2023-12-20 MED ORDER — CLOPIDOGREL BISULFATE 75 MG PO TABS
75.0000 mg | ORAL_TABLET | Freq: Every day | ORAL | Status: DC
  Administered 2023-12-20 – 2023-12-21 (×2): 75 mg via ORAL
  Filled 2023-12-20 (×2): qty 1

## 2023-12-20 MED ORDER — ACETAMINOPHEN 650 MG RE SUPP: 650.0000 mg | RECTAL | Status: DC | PRN

## 2023-12-20 MED ORDER — ATORVASTATIN CALCIUM 10 MG PO TABS
20.0000 mg | ORAL_TABLET | Freq: Every day | ORAL | Status: DC
  Administered 2023-12-20 – 2023-12-21 (×2): 20 mg via ORAL
  Filled 2023-12-20 (×2): qty 2

## 2023-12-20 MED ORDER — ENOXAPARIN SODIUM 40 MG/0.4ML IJ SOSY
40.0000 mg | PREFILLED_SYRINGE | INTRAMUSCULAR | Status: DC
  Administered 2023-12-20: 40 mg via SUBCUTANEOUS
  Filled 2023-12-20: qty 0.4

## 2023-12-20 MED ORDER — ACETAMINOPHEN 325 MG PO TABS: 650.0000 mg | ORAL_TABLET | ORAL | Status: DC | PRN

## 2023-12-20 NOTE — Plan of Care (Signed)
  Problem: Education: Goal: Knowledge of disease or condition will improve Outcome: Progressing Goal: Knowledge of secondary prevention will improve (MUST DOCUMENT ALL) Outcome: Progressing Goal: Knowledge of patient specific risk factors will improve (DELETE if not current risk factor) Outcome: Progressing   Problem: Ischemic Stroke/TIA Tissue Perfusion: Goal: Complications of ischemic stroke/TIA will be minimized Outcome: Progressing   Problem: Coping: Goal: Will verbalize positive feelings about self Outcome: Progressing Goal: Will identify appropriate support needs Outcome: Progressing   Problem: Health Behavior/Discharge Planning: Goal: Ability to manage health-related needs will improve Outcome: Progressing Goal: Goals will be collaboratively established with patient/family Outcome: Progressing   Problem: Self-Care: Goal: Ability to participate in self-care as condition permits will improve Outcome: Progressing Goal: Verbalization of feelings and concerns over difficulty with self-care will improve Outcome: Progressing   Problem: Nutrition: Goal: Risk of aspiration will decrease Outcome: Progressing Goal: Dietary intake will improve Outcome: Progressing   Problem: Education: Goal: Knowledge of General Education information will improve Description: Including pain rating scale, medication(s)/side effects and non-pharmacologic comfort measures Outcome: Progressing   Problem: Health Behavior/Discharge Planning: Goal: Ability to manage health-related needs will improve Outcome: Progressing

## 2023-12-20 NOTE — Evaluation (Signed)
 Occupational Therapy Evaluation Patient Details Name: Randy Walter MRN: [id number] DOB: [birth year] Today's Date: 12/20/2023   History of Present Illness   Pt is a 78 y.o. M presenting to [facility] on 12/20/23 w/ concern of R facial droop/twitching and slurred speech; episode lasting ~23mins. MRI was unremarkable. PMH is significant for CVA w/ no residual deficits, Plavix , HTN, HLD, CAD, COPD, and tobacco smoker of 2-3 cigarettes/day.     Clinical Impressions PTA pt lives independently with his wife and daughter, who assist with driving and IADL tasks as needed. Pt at his functional baseline with all symptoms resolved. Educated pt on signs/symptoms of CVA using [employer]. Pt verbalized understanding. No further OT needed.      If plan is discharge home, recommend the following:   Assist for transportation     Functional Status Assessment   Patient has not had a recent decline in their functional status     Equipment Recommendations   None recommended by OT     Recommendations for Other Services         Precautions/Restrictions   Precautions Precautions: None Restrictions Weight Bearing Restrictions Per Provider Order: No     Mobility Bed Mobility Overal bed mobility: Modified Independent             General bed mobility comments: increased time to complete    Transfers Overall transfer level: Modified independent Equipment used: None               General transfer comment: increased time to complete      Balance Overall balance assessment: Modified Independent                                         ADL either performed or assessed with clinical judgement   ADL Overall ADL's : At baseline                                             Vision Baseline Vision/History: 1 Wears glasses Ability to See in Adequate Light: 0 Adequate Vision Assessment?: Yes;No apparent visual deficits;Wears glasses for reading      Perception         Praxis         Pertinent Vitals/Pain Pain Assessment Pain Assessment: No/denies pain     Extremity/Trunk Assessment Upper Extremity Assessment Upper Extremity Assessment: Overall WFL for tasks assessed   Lower Extremity Assessment Lower Extremity Assessment: Defer to PT evaluation   Cervical / Trunk Assessment Cervical / Trunk Assessment: Kyphotic   Communication Communication Communication: No apparent difficulties   Cognition Arousal: Alert Behavior During Therapy: WFL for tasks assessed/performed Cognition: No apparent impairments                               Following commands: Intact       Cueing  General Comments   Cueing Techniques: Verbal cues;Visual cues;Gestural cues  no signs of acute distress   Exercises     Shoulder Instructions      Home Living Family/patient expects to be discharged to:: Private residence Living Arrangements: Spouse/significant other;Children Available Help at Discharge: Family;Available 24 hours/day;Available PRN/intermittently (wife is able to help 24/7 and daughter intermittently) Type of Home: House Home Access:  Other (comment) (small lip)     Home Layout: Two level Alternate Level Stairs-Number of Steps: 14 Alternate Level Stairs-Rails: Right Bathroom Shower/Tub: Tub/shower unit;Curtain   Bathroom Toilet: Handicapped height Bathroom Accessibility: Yes How Accessible: Accessible via walker Home Equipment: None          Prior Functioning/Environment Prior Level of Function : Independent/Modified Independent (retired)             Mobility Comments: independent w/out an AD ADLs Comments: independent; active; exercises    OT Problem List: Decreased knowledge of precautions   OT Treatment/Interventions:        OT Goals(Current goals can be found in the care plan section)   Acute Rehab OT Goals Patient Stated Goal: home OT Goal Formulation: All assessment and  education complete, DC therapy   OT Frequency:       Co-evaluation              AM-PAC OT 6 Clicks Daily Activity     Outcome Measure Help from another person eating meals?: None Help from another person taking care of personal grooming?: None Help from another person toileting, which includes using toliet, bedpan, or urinal?: None Help from another person bathing (including washing, rinsing, drying)?: None Help from another person to put on and taking off regular upper body clothing?: None Help from another person to put on and taking off regular lower body clothing?: None 6 Click Score: 24   End of Session Nurse Communication: Mobility status  Activity Tolerance: Patient tolerated treatment well Patient left: in bed;with call bell/phone within reach                   Time: 8696-8682 OT Time Calculation (min): 14 min Charges:  OT General Charges $OT Visit: 1 Visit OT Evaluation $OT Eval Low Complexity: 1 Low  Kreg Sink, OT/L   Acute OT Clinical Specialist Acute Rehabilitation Services Pager [phone] Office [phone]   [facility] 12/20/2023, 1:19 PM

## 2023-12-20 NOTE — H&P (Signed)
 HISTORY AND PHYSICAL    Randy Walter   FMW:[id number] DOB: 03/11/1946   Date of Service: 12/20/23 Requesting physician/APP from ED: Treatment Team:  Attending Provider: Marsa Edelman, DO  PCP: Levern Hutching, MD     HPI: Randy Walter is a 78 year old male w/ PMH CVA no residual deficits on aspirin , Plavix , statin at home, HTN, HLD, CAD, COPD and current tobacco smoker 2 to 3 cigarettes/day.  Presents to the ED with concern for right facial droop/twitching and slurred speech, episode lasting about 10 minutes at home, resolved upon EMS arrival.  Brought to the ED as code stroke.  Neurology consult, MRI brain no acute concerns, recommending further workup for TIA versus focal seizure, including CTA head/neck, echocardiogram, EEG.  Hospitalist consulted for admission    Consultants:  Neurology   Procedures: none      ASSESSMENT & PLAN:   Facial twitching/weakness and speech difficulty concerning for TIA versus focal seizure History of CVA/TIA without residual deficits MRI brain nonacute Neurology following CTA head/neck pending Echocardiogram pending EEG pending PT/OT/SLP consults pending Neurochecks per protocol A1c, fasting lipid panel pending Aspirin , Plavix , further course to be determined by neurology based on vascular imaging Smoking cessation advised Holding home antihypertensives for now  History of hypertension Home on amlodipine  and lisinopril  Holding home antihypertensives for now as of hypertension  COPD Tobacco abuse/dependence Patient is not on home oxygen or inhalers Nicotine  patch as needed Check pulse ox with vital signs  Mild leukopenia Repeat CBC, if still low WBC add differential       DVT prophylaxis: lovenox  Pertinent IV fluids/nutrition: No continuous IV fluids, n.p.o. pending swallow eval, if passes bedside swallow screen can have heart healthy diet Central lines / invasive devices: none  Code Status: FULL CODE  confirmed with patient upon admission, his wife or his daughter may make medical decisions in the event that he is unable to do Family Communication: none at this time, patient declined call to family given late hour  Disposition: observation TOC needs: TBD but expect to return home to previous environment Barriers to discharge / significant pending items: TIA/seizure workup as above                  Review of Systems:  Review of Systems  Constitutional:  Negative for chills, fever, malaise/fatigue and weight loss.  HENT:  Negative for hearing loss, sinus pain, sore throat and tinnitus.   Eyes:  Negative for blurred vision and double vision.  Respiratory:  Positive for shortness of breath (chronic). Negative for cough.   Cardiovascular:  Negative for chest pain, palpitations and leg swelling.  Gastrointestinal:  Negative for abdominal pain, heartburn, nausea and vomiting.  Genitourinary:  Negative for dysuria and frequency.  Musculoskeletal:  Negative for joint pain and myalgias.  Neurological:  Positive for speech change. Negative for dizziness, tingling, tremors and focal weakness.  Psychiatric/Behavioral:  Negative for depression.        has a past medical history of Arthritis, CAD (coronary artery disease), CHF (congestive heart failure) (HCC), COPD (chronic obstructive pulmonary disease) (HCC), Dyspnea, High cholesterol, Hypertension, Stroke (HCC), and Tobacco use.  No current facility-administered medications on file prior to encounter.   Current Outpatient Medications on File Prior to Encounter  Medication Sig Dispense Refill   amLODipine  (NORVASC ) 10 MG tablet Take 10 mg by mouth daily.     aspirin  EC 81 MG tablet Take 1 tablet (81 mg total) by mouth daily.     atorvastatin  (  LIPITOR ) 10 MG tablet Take 1 tablet (10 mg total) by mouth daily. 30 tablet 1   carvedilol  (COREG ) 12.5 MG tablet Take 12.5 mg by mouth 2 (two) times daily.     clopidogrel  (PLAVIX ) 75 MG  tablet TAKE 1 TABLET BY MOUTH ONCE DAILY WITH FOOD     gabapentin  (NEURONTIN ) 300 MG capsule Take 1 capsule (300 mg total) by mouth 3 (three) times daily. (Patient not taking: Reported on 04/05/2019) 15 capsule 0   HYDROcodone -acetaminophen  (NORCO/VICODIN) 5-325 MG tablet Take 1 tablet by mouth every 6 (six) hours as needed for moderate pain. (Patient not taking: Reported on 04/05/2019) 30 tablet 0   lisinopril  (PRINIVIL ,ZESTRIL ) 20 MG tablet Take 40 mg by mouth 2 (two) times daily.      lisinopril  (ZESTRIL ) 40 MG tablet Take 40 mg by mouth 2 (two) times daily.     nitroGLYCERIN  (NITROSTAT ) 0.4 MG SL tablet Place 1 tablet (0.4 mg total) under the tongue every 5 (five) minutes x 3 doses as needed for chest pain. 25 tablet 12   polyethylene glycol (MIRALAX  / GLYCOLAX ) packet Take 17 g by mouth 2 (two) times daily as needed for mild constipation or moderate constipation. (Patient not taking: Reported on 04/05/2019) 14 each 0   traMADol  (ULTRAM ) 50 MG tablet Take 1 tablet (50 mg total) by mouth every 6 (six) hours as needed. (Patient not taking: Reported on 04/05/2019) 12 tablet 0     No Known Allergies    family history includes CAD in his maternal grandmother.   Past Surgical History:  Procedure Laterality Date   CORONARY ANGIOPLASTY WITH STENT PLACEMENT  2007   to mid LAD /notes 10/17/2016   INGUINAL HERNIA REPAIR Right 04/08/2017   Procedure: RIGHT INGUINAL HERNIA REPAIR  WITH MESH;  Surgeon: Belinda Cough, MD;  Location: [facility] OR;  Service: General;  Laterality: Right;   INSERTION OF MESH N/A 04/08/2017   Procedure: INSERTION OF MESH;  Surgeon: Belinda Cough, MD;  Location: [facility] OR;  Service: General;  Laterality: N/A;   LEFT HEART CATH AND CORONARY ANGIOGRAPHY N/A 10/18/2016   Procedure: Left Heart Cath and Coronary Angiography;  Surgeon: Rober Chroman, MD;  Location: [facility] INVASIVE CV LAB;  Service: Cardiovascular;  Laterality: N/A;   UMBILICAL HERNIA REPAIR N/A 04/08/2017   Procedure: UMBILICAL  HERNIA REPAIR WITH MESH;  Surgeon: Belinda Cough, MD;  Location: [facility] OR;  Service: General;  Laterality: N/A;    Social History   Socioeconomic History   Marital status: Married    Spouse name: Not on file   Number of children: Not on file   Years of education: Not on file   Highest education level: Not on file  Occupational History   Not on file  Tobacco Use   Smoking status: Every Day    Current packs/day: 0.50    Average packs/day: 0.5 packs/day for 60.0 years (30.0 ttl pk-yrs)    Types: Cigarettes   Smokeless tobacco: Former    Types: Snuff, Chew   Tobacco comments:    only few cigarrettes a day now   Substance and Sexual Activity   Alcohol use: Yes    Comment: 10/17/2016 nothing in 4-5 years; did drink 1 pint to 1 1/2 pints qd except Saturday; would drink 1/5 on Saturdays; not as much on Sundays   Drug use: No   Sexual activity: Not Currently  Other Topics Concern   Not on file  Social History Narrative   Right handed  Live with wife in one level home      Caffeine - 2 coffee cups a day / 12 oz soda during day      Exercise - no      Education - 10th grade      Retired   Chief Executive Officer Drivers of Corporate investment banker Strain: Not on file  Food Insecurity: Not on file  Transportation Needs: Not on file  Physical Activity: Not on file  Stress: Not on file  Social Connections: Not on file  Intimate Partner Violence: Not on file         Objective Findings:  Vitals:   12/19/23 1956 12/19/23 2351  BP: 129/77 (!) 132/107  Pulse: 77 68  Resp: 18 14  Temp: 98.3 F (36.8 C) 97.9 F (36.6 C)  TempSrc:  Oral  SpO2: 99% 98%   No intake or output data in the 24 hours ending 12/20/23 0053 There were no vitals filed for this visit.  Examination:  Physical Exam Constitutional:      General: He is not in acute distress.    Appearance: He is not ill-appearing.   Cardiovascular:     Rate and Rhythm: Normal rate and regular rhythm.  Pulmonary:      Effort: Pulmonary effort is normal.     Breath sounds: Normal breath sounds.  Abdominal:     General: Abdomen is flat.     Palpations: Abdomen is soft.   Musculoskeletal:     Right lower leg: No edema.     Left lower leg: No edema.   Skin:    General: Skin is warm and dry.   Neurological:     General: No focal deficit present.     Mental Status: He is alert and oriented to person, place, and time.   Psychiatric:        Mood and Affect: Mood normal.        Behavior: Behavior normal.          Scheduled Medications:   [START ON 12/21/2023]  stroke: early stages of recovery book   Does not apply Once   aspirin  EC  81 mg Oral Daily   clopidogrel   75 mg Oral Daily   enoxaparin  (LOVENOX ) injection  40 mg Subcutaneous Q24H    Continuous Infusions:   PRN Medications:  acetaminophen  **OR** acetaminophen  (TYLENOL ) oral liquid 160 mg/5 mL **OR** acetaminophen   Antimicrobials:  Anti-infectives (From admission, onward)    None           Data Reviewed: I have personally reviewed following labs and imaging studies  CBC: Recent Labs  Lab 12/19/23 2058  WBC 3.9*  HGB 11.7*  HCT 38.7*  MCV 92.1  PLT 248   Basic Metabolic Panel: Recent Labs  Lab 12/19/23 2058  NA 142  K 4.3  CL 107  CO2 25  GLUCOSE 100*  BUN 18  CREATININE 1.09  CALCIUM  9.5   GFR: CrCl cannot be calculated (Unknown ideal weight.). Liver Function Tests: Recent Labs  Lab 12/19/23 2058  AST 19  ALT 12  ALKPHOS 61  BILITOT 0.5  PROT 6.7  ALBUMIN 3.5   No results for input(s): LIPASE, AMYLASE in the last 168 hours. No results for input(s): AMMONIA in the last 168 hours. Coagulation Profile: No results for input(s): INR, PROTIME in the last 168 hours. Cardiac Enzymes: No results for input(s): CKTOTAL, CKMB, CKMBINDEX, TROPONINI in the last 168 hours. BNP (last 3 results) No results for input(s): PROBNP in  the last 8760 hours. HbA1C: No results for input(s):  HGBA1C in the last 72 hours. CBG: No results for input(s): GLUCAP in the last 168 hours. Lipid Profile: No results for input(s): CHOL, HDL, LDLCALC, TRIG, CHOLHDL, LDLDIRECT in the last 72 hours. Thyroid  Function Tests: No results for input(s): TSH, T4TOTAL, FREET4, T3FREE, THYROIDAB in the last 72 hours. Anemia Panel: No results for input(s): VITAMINB12, FOLATE, FERRITIN, TIBC, IRON, RETICCTPCT in the last 72 hours. Most Recent Urinalysis On File:     Component Value Date/Time   COLORURINE YELLOW 04/07/2022 1056   APPEARANCEUR CLEAR 04/07/2022 1056   LABSPEC 1.027 04/07/2022 1056   PHURINE 5.0 04/07/2022 1056   GLUCOSEU NEGATIVE 04/07/2022 1056   HGBUR NEGATIVE 04/07/2022 1056   BILIRUBINUR NEGATIVE 04/07/2022 1056   KETONESUR NEGATIVE 04/07/2022 1056   PROTEINUR NEGATIVE 04/07/2022 1056   UROBILINOGEN 1.0 02/05/2015 1730   NITRITE NEGATIVE 04/07/2022 1056   LEUKOCYTESUR TRACE (A) 04/07/2022 1056   Sepsis Labs: @LABRCNTIP (procalcitonin:4,lacticidven:4)  No results found for this or any previous visit (from the past 240 hours).       Radiology Studies: MR Brain W and Wo Contrast Result Date: 12/20/2023 CLINICAL DATA:  Transient ischemic attack EXAM: MRI HEAD WITHOUT AND WITH CONTRAST TECHNIQUE: Multiplanar, multiecho pulse sequences of the brain and surrounding structures were obtained without and with intravenous contrast. CONTRAST:  8mL GADAVIST  GADOBUTROL  1 MMOL/ML IV SOLN COMPARISON:  01/27/2019 FINDINGS: Brain: No acute infarct, mass effect or extra-axial collection. No acute or chronic hemorrhage. Old right basal ganglia small vessel infarct. Normal CSF spaces. Old cerebellar small vessel infarcts. The midline structures are normal. There is no abnormal contrast enhancement. Vascular: Normal flow voids. Skull and upper cervical spine: Normal calvarium and skull base. Visualized upper cervical spine and soft tissues are normal.  Sinuses/Orbits:Left maxillary sinus retention cyst. IMPRESSION: 1. No acute intracranial abnormality. 2. Old right basal ganglia and cerebellar small vessel infarcts. Electronically Signed   By: Franky Stanford M.D.   On: 12/20/2023 00:02             LOS: 0 days       Laneta Blunt, DO [facility] 12/20/2023, 12:53 AM    Dictation software may have been used to generate the above note. Typos may occur and escape review in typed/dictated notes. Please contact Dr Blunt directly for clarity if needed.  Staff may message me via secure chat in Epic  but this may not receive an immediate response,  please page me for urgent matters!  If 7PM-7AM, please contact night coverage [email or web address]

## 2023-12-20 NOTE — Progress Notes (Addendum)
 STROKE TEAM PROGRESS NOTE    SIGNIFICANT HOSPITAL EVENTS  6/19:  facial twitching with the mouth moving from the left to the right side, lasting approximately ten minutes, accompanied by slurred speech.  MRI negative.   INTERIM HISTORY/SUBJECTIVE  Pending Routine EEG read, no further seizure-like activity seen.  Neurological exam with some intermittent slight dysarthria only.  OBJECTIVE  CBC    Component Value Date/Time   WBC 4.2 12/20/2023 0200   RBC 3.98 (L) 12/20/2023 0200   HGB 11.2 (L) 12/20/2023 0200   HCT 36.5 (L) 12/20/2023 0200   PLT 215 12/20/2023 0200   MCV 91.7 12/20/2023 0200   MCH 28.1 12/20/2023 0200   MCHC 30.7 12/20/2023 0200   RDW 13.7 12/20/2023 0200   LYMPHSABS 1.0 04/07/2022 0307   MONOABS 0.5 04/07/2022 0307   EOSABS 0.0 04/07/2022 0307   BASOSABS 0.0 04/07/2022 0307    BMET    Component Value Date/Time   NA 142 12/19/2023 2058   K 4.3 12/19/2023 2058   CL 107 12/19/2023 2058   CO2 25 12/19/2023 2058   GLUCOSE 100 (H) 12/19/2023 2058   BUN 18 12/19/2023 2058   CREATININE 0.84 12/20/2023 0200   CALCIUM  9.5 12/19/2023 2058   GFRNONAA >60 12/20/2023 0200    IMAGING past 24 hours CT ANGIO HEAD NECK W WO CM Result Date: 12/20/2023 EXAM: CT HEAD WITHOUT CTA HEAD AND NECK WITH AND WITHOUT 12/20/2023 05:15:46 AM TECHNIQUE: CTA of the head and neck was performed with and without the administration of intravenous contrast. Noncontrast CT of the head with reconstructed 2-D images are also provided for review. Multiplanar 2D and/or 3D reformatted images are provided for review. Automated exposure control, iterative reconstruction, and/or weight based adjustment of the mA/kV was utilized to reduce the radiation dose to as low as reasonably achievable. COMPARISON: CT angio head and neck 01/27/2019. MR head without and with contrast 12/19/2023. CLINICAL HISTORY: Stroke/TIA, determine embolic source. 75ml omni 350 IV. BIB EMS sudden right sided facial droop  that last 10 minutes. Previous stroke with no deficits. Pt ambulatory with EMS; LKW 1840 lasted 10 minutes. FINDINGS: CT HEAD: BRAIN AND VENTRICLES: No acute intracranial hemorrhage. No mass effect or midline shift. No extra-axial fluid collection. Gray-white differentiation is maintained. No hydrocephalus. ORBITS: No acute abnormality. SINUSES: No acute abnormality. SOFT TISSUES AND SKULL: No acute abnormality. CTA NECK: AORTIC ARCH AND ARCH VESSELS: Atherosclerotic changes are present at the distal aortic arch and origin of the left subclavian artery without focal stenosis. The origins of the dominant vertebral artery and left common carotid artery are not imaged. CERVICAL CAROTID ARTERIES: Atherosclerotic changes are present at the right carotid bifurcation. A high-grade proximal stenosis is stable. A tandem 50% stenosis is stable. The more distal cervical right internal carotid artery is decreased in size compared to the left, stable. Atherosclerotic changes are present at the left carotid bifurcation. A 50% stenosis is present relative to the more distal vessel. CERVICAL VERTEBRAL ARTERIES: The left vertebral artery is the dominant vessel. It originates from the subclavian artery without focal stenosis. The right vertebral artery is occluded proximally and reconstituted at the v3 segment via muscular branches. VISUALIZED LUNGS AND MEDIASTINUM: Centrilobular emphysema changes are present. No nodule or mass lesion is present. No effusion or pneumothorax is present. SOFT TISSUES: No acute abnormality. BONES: Multilevel degenerative changes are again noted within the cervical spine. Dental caries are present within residual teeth. CTA HEAD: ANTERIOR CIRCULATION: Dense atherosclerotic calcifications are present within the cavernous internal  carotid arteries bilaterally. 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 The ICA terminali are within normal limits bilaterally. The left a1 segment is dominant. The  ACA and MCA branch vessels are normal bilaterally. No aneurysm is present. POSTERIOR CIRCULATION: The right vertebral artery terminates at the PICA. Left PICA origin is visualized and normal. Irregularity is present in the distal basilar artery with a 50% stenosis. The superior cerebellar arteries are patent bilaterally. Two posterior cerebral arteries originate from the basilar tip. A prominent right posterior communicating artery contributes. Moderate atherosclerosis, stable. OTHER: No dural venous sinus thrombosis on this non-dedicated study. IMPRESSION: 1. No acute intracranial hemorrhage or other acute abnormality. 2. Stable severe cavernous ICA stenoses bilaterally. 3. Stable high-grade proximal and tandem 50% stenosis at the right carotid bifurcation. 4. 50% stenosis at the left carotid bifurcation. 5. Irregularity in the distal basilar artery with a 50% stenosis. Electronically signed by: Lonni Necessary MD 12/20/2023 05:37 AM EDT RP Workstation: [id number]   MR Brain W and Wo Contrast Result Date: 12/20/2023 CLINICAL DATA:  Transient ischemic attack EXAM: MRI HEAD WITHOUT AND WITH CONTRAST TECHNIQUE: Multiplanar, multiecho pulse sequences of the brain and surrounding structures were obtained without and with intravenous contrast. CONTRAST:  8mL GADAVIST  GADOBUTROL  1 MMOL/ML IV SOLN COMPARISON:  01/27/2019 FINDINGS: Brain: No acute infarct, mass effect or extra-axial collection. No acute or chronic hemorrhage. Old right basal ganglia small vessel infarct. Normal CSF spaces. Old cerebellar small vessel infarcts. The midline structures are normal. There is no abnormal contrast enhancement. Vascular: Normal flow voids. Skull and upper cervical spine: Normal calvarium and skull base. Visualized upper cervical spine and soft tissues are normal. Sinuses/Orbits:Left maxillary sinus retention cyst. IMPRESSION: 1. No acute intracranial abnormality. 2. Old right basal ganglia and cerebellar small vessel  infarcts. Electronically Signed   By: Franky Stanford M.D.   On: 12/20/2023 00:02    Vitals:   12/20/23 0745 12/20/23 0800 12/20/23 0815 12/20/23 0844  BP: (!) 173/95 (!) 179/153 (!) 172/90 (!) 159/90  Pulse: 73 81 66 73  Resp: 18 19 15 19   Temp:    (!) 97.4 F (36.3 C)  TempSrc:    Oral  SpO2: 94% 95% 97% 97%  Weight:    66 kg  Height:    6' 1 (1.854 m)     PHYSICAL EXAM General:  Alert, well-nourished, well-developed patient in no acute distress Psych:  Mood and affect appropriate for situation CV: Regular rate and rhythm on monitor Respiratory:  Regular, unlabored respirations on room air GI: Abdomen soft and nontender   NEURO:  Mental Status: AA&Ox3, patient is able to give clear and coherent history Speech/Language: Slight intermittent dysarthria. No aphasia.   Cranial Nerves:  II: PERRL. Visual fields full.  III, IV, VI: EOMI. Eyelids elevate symmetrically.  V: Sensation is intact to light touch and symmetrical to face.  VII: Face is symmetrical resting and smiling VIII: hearing intact to voice. IX, X: Slight intermittent dysarthria.  KP:Dynloizm shrug 5/5. XII: tongue is midline without fasciculations. Motor:Generalized weakness Tone: is normal and bulk is normal Sensation- Intact to light touch bilaterally. Extinction absent to light touch to DSS.   Coordination: FTN intact bilaterally, HKS: no ataxia in BLE.No drift.  Gait- deferred  Most Recent NIH 1.    ASSESSMENT/PLAN  Randy Walter is a 78 y.o. 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 MRI brain no  acute concerns, admitted for workup for TIA versus focal seizure.   TIA due to large vessel stenosis versus focal seizure CT head:  No acute hemorrhage or other acute abnormality CTA head & neck  Stable severe cavernous ICA stenosis bilaterally Stable high-grade proximal and tandem  50% stenosis right carotid bifurcation 50% stenosis left carotid bifurcation Irregularity in distal basilar artery with 50% stenosis MRI   No acute intracranial abnormality Old R basal ganglia and cerebellar small vessel infarcts EEG 6/21: PENDING  2D Echo: PENDING LDL 60 HgbA1c 6.1 UDS negative P2 Y12 pending  VTE prophylaxis - lovenox  aspirin  81 mg daily and clopidogrel  75 mg daily prior to admission, now  aspirin  81 mg daily and clopidogrel  75 mg daily.  May consider medication changes based on P2 Y12 result if needed Therapy recommendations:  No follow up needed  Disposition:  pending   Hx of Stroke/TIA Per Dr. Jayme note, in 08/2005, hospitalized for CVA due to left-sided weakness.  MRI brain showed acute right frontoparietal infarct in the periventricular white matter.  MRI of head showed moderate intracranial atherosclerosis Per Dr. Retta notes, in 12/2018 patient admitted for slurred speech and word finding difficulty, lasted 5 minutes.  CTA head and neck showed right VA origin and V4 occlusion, left VA origin moderate stenosis, bilateral ICA siphon high-grade stenosis.  Moderate stenosis at left P1, A2 and bilateral M2/M3.  MRI showed old small vessel infarct in the cerebellum and right cerebral hemisphere white matter but no acute findings.  He was discharged on aspirin  81 and Lipitor  10.  Subsequently PCP added Plavix  for DAPT. He follows Dr. Skeet at [facility]  Hypertension Home meds: Norvasc  10 mg daily, Coreg  12.5 mg twice daily, losartan 100 mg daily Stable Long-term BP goal normotensive  Hyperlipidemia Home meds:  Lipitor  10mg   LDL 60, goal < 70 Now on Lipitor  20 No high intensity statin given LDL at goal Continue statin at discharge  Tobacco Abuse Patient smokes 2-3 cigarettes per day for 60 years Nicotine  replacement therapy provided Patient is willing to quit  Other Stroke Risk Factors Advanced age Coronary artery disease  Other Active Problems COPD No home  oxygen required Remains on room air inpatient  Hospital day # 0   Pt seen by Neuro NP/APP and later by MD. Note/plan to be edited by MD as needed.    Rocky JAYSON Likes, DNP, AGACNP-BC [facility] Please use AMION for contact information & EPIC for messaging.  ATTENDING NOTE: I reviewed above note and agree with the assessment and plan. Pt was seen and examined.   No family at bedside.  Patient awake, alert, eyes open, orientated to age, place, time. No aphasia, fluent language, following all simple commands. Able to name and repeat. No gaze palsy, tracking bilaterally, visual field full, PERRL. No facial droop. Tongue midline. Bilateral UEs 5/5, no drift. Bilaterally LEs 5/5, no drift. Sensation symmetrical bilaterally, b/l FTN intact, gait not tested.   For detailed assessment and plan, please refer to above as I have made changes wherever appropriate.   Ary Cummins, MD PhD Stroke Neurology 12/20/2023 5:18 PM     To contact Stroke Continuity provider, please refer to [email or web address]. After hours, contact General Neurology

## 2023-12-20 NOTE — Progress Notes (Signed)
 Progress Note    Randy Walter   FMW:[id number]  DOB: 1946-06-26  DOA: 12/19/2023     0 PCP: Levern Hutching, MD  Initial CC: slurred speech, facial twitching   Hospital Course: Randy Walter is a 77 year old male w/ PMH CVA no residual deficits on aspirin , Plavix , statin at home, HTN, HLD, CAD, COPD and current tobacco smoker 2 to 3 cigarettes/day.  Presents to the ED with concern for right facial droop/twitching and slurred speech, episode lasting about 10 minutes at home, resolved upon EMS arrival.  Brought to the ED as code stroke.  Neurology consult, MRI brain no acute concerns, recommending further workup for TIA versus focal seizure, including CTA head/neck, echocardiogram, EEG.  A&P:  Facial twitching/weakness and speech difficulty concerning for TIA versus focal seizure History of CVA/TIA without residual deficits MRI brain nonacute; old right basal ganglia and cerebellar infarcts noted -CT angio head/neck shows stable high-grade proximal and tandem 50% stenosis at right carotid and left carotid bifurcation -Follow-up EEG -Follow-up PT/OT evals -Past bedside swallow screen, okay for diet -Continue aspirin  and Plavix  for now until further neurology recommendations   History of hypertension Home on amlodipine  and lisinopril    COPD Tobacco abuse/dependence Patient is not on home oxygen or inhalers - nicotine  patch PRN   Mild leukopenia - resolved  Interval History:  Resting comfortably in bed when seen this morning.  No further facial droop or twitching.    Old records reviewed in assessment of this patient  Antimicrobials:   DVT prophylaxis:  enoxaparin  (LOVENOX ) injection 40 mg Start: 12/20/23 1400   Code Status:   Code Status: Full Code  Mobility Assessment (Last 72 Hours)     Mobility Assessment     Row Name 12/20/23 1300 12/20/23 1200 12/20/23 0900       Does patient have an order for bedrest or is patient medically unstable -- -- No - Continue  assessment     What is the highest level of mobility based on the progressive mobility assessment? Level 6 (Walks independently in room and hall) - Balance while walking in room without assist - Complete Level 6 (Walks independently in room and hall) - Balance while walking in room without assist - Complete Level 5 (Walks with assist in room/hall) - Balance while stepping forward/back and can walk in room with assist - Complete        Barriers to discharge: none Disposition Plan:  Home HH orders placed: n/a Status is: Obs  Objective: Blood pressure (!) 159/90, pulse 73, temperature (!) 97.4 F (36.3 C), temperature source Oral, resp. rate 19, height 6' 1 (1.854 m), weight 66 kg, SpO2 97%.  Examination:  Physical Exam Constitutional:      General: He is not in acute distress.    Appearance: Normal appearance.  HENT:     Head: Normocephalic and atraumatic.     Mouth/Throat:     Mouth: Mucous membranes are moist.   Eyes:     Extraocular Movements: Extraocular movements intact.    Cardiovascular:     Rate and Rhythm: Normal rate and regular rhythm.  Pulmonary:     Effort: Pulmonary effort is normal. No respiratory distress.     Breath sounds: Normal breath sounds. No wheezing.  Abdominal:     General: Bowel sounds are normal. There is no distension.     Palpations: Abdomen is soft.     Tenderness: There is no abdominal tenderness.   Musculoskeletal:  General: Normal range of motion.     Cervical back: Normal range of motion and neck supple.   Skin:    General: Skin is warm and dry.   Neurological:     General: No focal deficit present.     Mental Status: He is alert.   Psychiatric:        Mood and Affect: Mood normal.        Behavior: Behavior normal.      Consultants:  Neurology  Procedures:    Data Reviewed: Results for orders placed or performed during the hospital encounter of 12/19/23 (from the past 24 hours)  CBC     Status: Abnormal    Collection Time: 12/19/23  8:58 PM  Result Value Ref Range   WBC 3.9 (L) 4.0 - 10.5 K/uL   RBC 4.20 (L) 4.22 - 5.81 MIL/uL   Hemoglobin 11.7 (L) 13.0 - 17.0 g/dL   HCT 61.2 (L) 60.9 - 47.9 %   MCV 92.1 80.0 - 100.0 fL   MCH 27.9 26.0 - 34.0 pg   MCHC 30.2 30.0 - 36.0 g/dL   RDW 86.0 88.4 - 84.4 %   Platelets 248 150 - 400 K/uL   nRBC 0.0 0.0 - 0.2 %  Comprehensive metabolic panel     Status: Abnormal   Collection Time: 12/19/23  8:58 PM  Result Value Ref Range   Sodium 142 135 - 145 mmol/L   Potassium 4.3 3.5 - 5.1 mmol/L   Chloride 107 98 - 111 mmol/L   CO2 25 22 - 32 mmol/L   Glucose, Bld 100 (H) 70 - 99 mg/dL   BUN 18 8 - 23 mg/dL   Creatinine, Ser 8.90 0.61 - 1.24 mg/dL   Calcium  9.5 8.9 - 10.3 mg/dL   Total Protein 6.7 6.5 - 8.1 g/dL   Albumin 3.5 3.5 - 5.0 g/dL   AST 19 15 - 41 U/L   ALT 12 0 - 44 U/L   Alkaline Phosphatase 61 38 - 126 U/L   Total Bilirubin 0.5 0.0 - 1.2 mg/dL   GFR, Estimated >39 >39 mL/min   Anion gap 10 5 - 15  Hemoglobin A1c     Status: Abnormal   Collection Time: 12/19/23  8:58 PM  Result Value Ref Range   Hgb A1c MFr Bld 6.1 (H) 4.8 - 5.6 %   Mean Plasma Glucose 128.37 mg/dL  Urine rapid drug screen (hosp performed)not at [facility]     Status: None   Collection Time: 12/20/23  1:30 AM  Result Value Ref Range   Opiates NONE DETECTED NONE DETECTED   Cocaine NONE DETECTED NONE DETECTED   Benzodiazepines NONE DETECTED NONE DETECTED   Amphetamines NONE DETECTED NONE DETECTED   Tetrahydrocannabinol NONE DETECTED NONE DETECTED   Barbiturates NONE DETECTED NONE DETECTED  Urinalysis, Routine w reflex microscopic -Urine, Clean Catch     Status: Abnormal   Collection Time: 12/20/23  1:30 AM  Result Value Ref Range   Color, Urine STRAW (A) YELLOW   APPearance CLEAR CLEAR   Specific Gravity, Urine 1.017 1.005 - 1.030   pH 7.0 5.0 - 8.0   Glucose, UA NEGATIVE NEGATIVE mg/dL   Hgb urine dipstick NEGATIVE NEGATIVE   Bilirubin Urine NEGATIVE NEGATIVE    Ketones, ur NEGATIVE NEGATIVE mg/dL   Protein, ur NEGATIVE NEGATIVE mg/dL   Nitrite NEGATIVE NEGATIVE   Leukocytes,Ua NEGATIVE NEGATIVE  Lipid panel     Status: None   Collection Time: 12/20/23  2:00 AM  Result Value Ref Range   Cholesterol 122 0 - 200 mg/dL   Triglycerides 42 <849 mg/dL   HDL 54 >59 mg/dL   Total CHOL/HDL Ratio 2.3 RATIO   VLDL 8 0 - 40 mg/dL   LDL Cholesterol 60 0 - 99 mg/dL  CBC     Status: Abnormal   Collection Time: 12/20/23  2:00 AM  Result Value Ref Range   WBC 4.2 4.0 - 10.5 K/uL   RBC 3.98 (L) 4.22 - 5.81 MIL/uL   Hemoglobin 11.2 (L) 13.0 - 17.0 g/dL   HCT 63.4 (L) 60.9 - 47.9 %   MCV 91.7 80.0 - 100.0 fL   MCH 28.1 26.0 - 34.0 pg   MCHC 30.7 30.0 - 36.0 g/dL   RDW 86.2 88.4 - 84.4 %   Platelets 215 150 - 400 K/uL   nRBC 0.0 0.0 - 0.2 %  Creatinine, serum     Status: None   Collection Time: 12/20/23  2:00 AM  Result Value Ref Range   Creatinine, Ser 0.84 0.61 - 1.24 mg/dL   GFR, Estimated >39 >39 mL/min    I have reviewed pertinent nursing notes, vitals, labs, and images as necessary. I have ordered labwork to follow up on as indicated.  I have reviewed the last notes from staff over past 24 hours. I have discussed patient's care plan and test results with nursing staff, CM/SW, and other staff as appropriate.  Time spent: Greater than 50% of the 55 minute visit was spent in counseling/coordination of care for the patient as laid out in the A&P.   LOS: 0 days   Alm Apo, MD [facility] 12/20/2023, 1:37 PM

## 2023-12-20 NOTE — Evaluation (Signed)
 Physical Therapy Evaluation Patient Details Name: Randy Walter MRN: [id number] DOB: 17-Sep-1945 Today's Date: 12/20/2023  History of Present Illness  Pt is a 78 y.o. M presenting to [facility] on 12/20/23 w/ concern of R facial droop/twitching and slurred speech; episode lasting ~71mins. MRI was unremarkable. PMH is significant for CVA w/ no residual deficits, Plavix , HTN, HLD, CAD, COPD, and tobacco smoker of 2-3 cigarettes/day.   Clinical Impression  Prior to admittance pt was mobilizing independently not utilizing an AD and was independent with ADLs. Pt presents to evaluation performing near baseline level of function with pt completing gait training and stair navigation with modified independence. Provided pt education on the importance of continued physical activity for health maintenance. No follow up therapies recommended; PT signing off.         If plan is discharge home, recommend the following:     Can travel by private vehicle        Equipment Recommendations None recommended by PT  Recommendations for Other Services       Functional Status Assessment Patient has not had a recent decline in their functional status     Precautions / Restrictions Precautions Precautions: Fall Recall of Precautions/Restrictions: Intact Restrictions Weight Bearing Restrictions Per Provider Order: No      Mobility  Bed Mobility Overal bed mobility: Modified Independent             General bed mobility comments: increased time to complete    Transfers Overall transfer level: Modified independent Equipment used: None               General transfer comment: increased time to complete    Ambulation/Gait Ambulation/Gait assistance: Modified independent (Device/Increase time) Gait Distance (Feet): 300 Feet Assistive device: None Gait Pattern/deviations: WFL(Within Functional Limits), Step-through pattern Gait velocity: functional Gait velocity interpretation: <1.8 ft/sec,  indicate of risk for recurrent falls   General Gait Details: Pt ambulates utilizing reciprocal gait strategy. No losses of balance noted.  Stairs Stairs: Yes Stairs assistance: Modified independent (Device/Increase time) Stair Management: Two rails, Forwards (4 > 6 both x2 trials) Number of Stairs: 2 General stair comments: reciprocal strategy utilizing bilateral rails  Wheelchair Mobility     Tilt Bed    Modified Rankin (Stroke Patients Only)       Balance Overall balance assessment: Modified Independent                                           Pertinent Vitals/Pain Pain Assessment Pain Assessment: No/denies pain    Home Living Family/patient expects to be discharged to:: Private residence Living Arrangements: Spouse/significant other;Children Available Help at Discharge: Family;Available 24 hours/day;Available PRN/intermittently (wife is able to help 24/7 and daughter intermittently) Type of Home: House Home Access: Other (comment) (small lip)     Alternate Level Stairs-Number of Steps: 14 Home Layout: Two level Home Equipment: None      Prior Function Prior Level of Function : Independent/Modified Independent (retired)             Mobility Comments: independent w/out an AD ADLs Comments: independent     Extremity/Trunk Assessment   Upper Extremity Assessment Upper Extremity Assessment: Overall WFL for tasks assessed    Lower Extremity Assessment Lower Extremity Assessment: Overall WFL for tasks assessed    Cervical / Trunk Assessment Cervical / Trunk Assessment: Kyphotic  Communication   Communication Communication: No  apparent difficulties    Cognition Arousal: Alert Behavior During Therapy: WFL for tasks assessed/performed   PT - Cognitive impairments: No apparent impairments                         Following commands: Intact       Cueing Cueing Techniques: Verbal cues, Visual cues, Gestural cues      General Comments General comments (skin integrity, edema, etc.): no signs of acute distress    Exercises     Assessment/Plan    PT Assessment Patient does not need any further PT services  PT Problem List         PT Treatment Interventions      PT Goals (Current goals can be found in the Care Plan section)  Acute Rehab PT Goals Patient Stated Goal: to go home PT Goal Formulation: With patient Time For Goal Achievement: 01/03/24 Potential to Achieve Goals: Good    Frequency       Co-evaluation               AM-PAC PT 6 Clicks Mobility  Outcome Measure Help needed turning from your back to your side while in a flat bed without using bedrails?: None Help needed moving from lying on your back to sitting on the side of a flat bed without using bedrails?: None Help needed moving to and from a bed to a chair (including a wheelchair)?: None Help needed standing up from a chair using your arms (e.g., wheelchair or bedside chair)?: None Help needed to walk in hospital room?: None Help needed climbing 3-5 steps with a railing? : None 6 Click Score: 24    End of Session Equipment Utilized During Treatment: Gait belt Activity Tolerance: Patient tolerated treatment well Patient left: in bed;with call bell/phone within reach Nurse Communication: Mobility status      Time: 8847-8789 PT Time Calculation (min) (ACUTE ONLY): 18 min   Charges:   PT Evaluation $PT Eval Low Complexity: 1 Low   PT General Charges $$ ACUTE PT VISIT: 1 Visit         Leontine Hilt, SPT Acute Rehab [phone]   Leontine Hilt 12/20/2023, 1:02 PM

## 2023-12-20 NOTE — Progress Notes (Addendum)
 Routine EEG completed, results pending Neurology review and interpretation

## 2023-12-20 NOTE — Hospital Course (Signed)
 Randy Walter is a 78 year old male w/ PMH CVA no residual deficits on aspirin , Plavix , statin at home, HTN, HLD, CAD, COPD and current tobacco smoker 2 to 3 cigarettes/day.  Presents to the ED with concern for right facial droop/twitching and slurred speech, episode lasting about 10 minutes at home, resolved upon EMS arrival.  Brought to the ED as code stroke.  Neurology consult, MRI brain no acute concerns, recommending further workup for TIA versus focal seizure, including CTA head/neck, echocardiogram, EEG.  A&P:  TIA History of CVA/TIA without residual deficits MRI brain nonacute; old right basal ganglia and cerebellar infarcts noted -CT angio head/neck shows stable high-grade proximal and tandem 50% stenosis at right carotid and left carotid bifurcation - EEG unremarkable - neurology recommending continue on DAPT - lipitor  increased to 20 mg daily at discharge    History of hypertension - amlodipine  and lisinopril    COPD Tobacco abuse/dependence Patient is not on home oxygen or inhalers - nicotine  patch PRN   Mild leukopenia - resolved

## 2023-12-21 ENCOUNTER — Observation Stay (HOSPITAL_BASED_OUTPATIENT_CLINIC_OR_DEPARTMENT_OTHER): Payer: Medicare (Managed Care)

## 2023-12-21 DIAGNOSIS — G459 Transient cerebral ischemic attack, unspecified: Secondary | ICD-10-CM

## 2023-12-21 DIAGNOSIS — R569 Unspecified convulsions: Secondary | ICD-10-CM

## 2023-12-21 LAB — BASIC METABOLIC PANEL WITH GFR
Anion gap: 7 (ref 5–15)
BUN: 16 mg/dL (ref 8–23)
CO2: 24 mmol/L (ref 22–32)
Calcium: 8.7 mg/dL — ABNORMAL LOW (ref 8.9–10.3)
Chloride: 106 mmol/L (ref 98–111)
Creatinine, Ser: 1.18 mg/dL (ref 0.61–1.24)
GFR, Estimated: >60 mL/min (ref >60)
Glucose, Bld: 96 mg/dL (ref 70–99)
Potassium: 4.1 mmol/L (ref 3.5–5.1)
Sodium: 137 mmol/L (ref 135–145)

## 2023-12-21 LAB — ECHOCARDIOGRAM COMPLETE
AR max vel: 2.5 cm2
AV Area VTI: 2.42 cm2
AV Area mean vel: 2.04 cm2
AV Mean grad: 3 mmHg
AV Peak grad: 5.5 mmHg
Ao pk vel: 1.17 m/s
Area-P 1/2: 3.74 cm2
Calc EF: 56.6 %
Height: 73 in
S' Lateral: 2.6 cm
Single Plane A2C EF: 56.3 %
Single Plane A4C EF: 56 %
Weight: 2328.06 [oz_av]

## 2023-12-21 LAB — CBC
HCT: 36.7 % — ABNORMAL LOW (ref 39.0–52.0)
Hemoglobin: 11.6 g/dL — ABNORMAL LOW (ref 13.0–17.0)
MCH: 28.4 pg (ref 26.0–34.0)
MCHC: 31.6 g/dL (ref 30.0–36.0)
MCV: 89.7 fL (ref 80.0–100.0)
Platelets: 213 10*3/uL (ref 150–400)
RBC: 4.09 MIL/uL — ABNORMAL LOW (ref 4.22–5.81)
RDW: 13.7 % (ref 11.5–15.5)
WBC: 4.5 10*3/uL (ref 4.0–10.5)
nRBC: 0 % (ref 0.0–0.2)

## 2023-12-21 LAB — PLATELET INHIBITION P2Y12: Platelet Function  P2Y12: 193 [PRU] (ref 182–335)

## 2023-12-21 MED ORDER — ATORVASTATIN CALCIUM 20 MG PO TABS: 20.0000 mg | ORAL_TABLET | Freq: Every day | ORAL | 3 refills | Status: AC

## 2023-12-21 NOTE — Plan of Care (Signed)
  Problem: Education: Goal: Knowledge of disease or condition will improve Outcome: Progressing Goal: Knowledge of secondary prevention will improve (MUST DOCUMENT ALL) Outcome: Progressing Goal: Knowledge of patient specific risk factors will improve (DELETE if not current risk factor) Outcome: Progressing   Problem: Ischemic Stroke/TIA Tissue Perfusion: Goal: Complications of ischemic stroke/TIA will be minimized Outcome: Progressing   Problem: Coping: Goal: Will verbalize positive feelings about self Outcome: Progressing Goal: Will identify appropriate support needs Outcome: Progressing   Problem: Health Behavior/Discharge Planning: Goal: Ability to manage health-related needs will improve Outcome: Progressing Goal: Goals will be collaboratively established with patient/family Outcome: Progressing   Problem: Self-Care: Goal: Ability to participate in self-care as condition permits will improve Outcome: Progressing Goal: Verbalization of feelings and concerns over difficulty with self-care will improve Outcome: Progressing Goal: Ability to communicate needs accurately will improve Outcome: Progressing   Problem: Nutrition: Goal: Risk of aspiration will decrease Outcome: Progressing Goal: Dietary intake will improve Outcome: Progressing   Problem: Education: Goal: Knowledge of General Education information will improve Description: Including pain rating scale, medication(s)/side effects and non-pharmacologic comfort measures Outcome: Progressing

## 2023-12-21 NOTE — Care Management Obs Status (Signed)
 MEDICARE OBSERVATION STATUS NOTIFICATION   Patient Details  Name: ABIGAIL MARSIGLIA MRN: [id number] Date of Birth: 1945/11/09   Medicare Observation Status Notification Given:  Yes    Carletha Spruce, RN 12/21/2023, 8:48 AM

## 2023-12-21 NOTE — Progress Notes (Signed)
*  PRELIMINARY RESULTS* Echocardiogram 2D Echocardiogram has been performed.  Randy Walter 12/21/2023, 10:36 AM

## 2023-12-21 NOTE — Discharge Summary (Signed)
 Physician Discharge Summary   HERBY AMICK FMW:[id number] DOB: October 01, 1945 DOA: 12/19/2023  PCP: Levern Hutching, MD  Admit date: 12/19/2023 Discharge date: 12/21/2023  Admitted From: Home Disposition: Home Discharging physician: Alm Apo, MD Barriers to discharge: None  Discharge Condition: stable CODE STATUS: Full Diet recommendation:  Diet Orders (From admission, onward)     Start     Ordered   12/21/23 0000  Diet - low sodium heart healthy        12/21/23 1046   12/20/23 1028  Diet Heart Room service appropriate? Yes; Fluid consistency: Thin  Diet effective now       Question Answer Comment  Room service appropriate? Yes   Fluid consistency: Thin      12/20/23 1027            Hospital Course: RYKAR LEBLEU is a 78 year old male w/ PMH CVA no residual deficits on aspirin , Plavix , statin at home, HTN, HLD, CAD, COPD and current tobacco smoker 2 to 3 cigarettes/day.  Presents to the ED with concern for right facial droop/twitching and slurred speech, episode lasting about 10 minutes at home, resolved upon EMS arrival.  Brought to the ED as code stroke.  Neurology consult, MRI brain no acute concerns, recommending further workup for TIA versus focal seizure, including CTA head/neck, echocardiogram, EEG.  A&P:  TIA History of CVA/TIA without residual deficits MRI brain nonacute; old right basal ganglia and cerebellar infarcts noted -CT angio head/neck shows stable high-grade proximal and tandem 50% stenosis at right carotid and left carotid bifurcation - EEG unremarkable - neurology recommending continue on DAPT - lipitor  increased to 20 mg daily at discharge    History of hypertension - amlodipine  and lisinopril    COPD Tobacco abuse/dependence Patient is not on home oxygen or inhalers - nicotine  patch PRN   Mild leukopenia - resolved   The patient's acute and chronic medical conditions were treated accordingly. On day of discharge, patient was felt  deemed stable for discharge. Patient/family member advised to call PCP or come back to ER if needed.   Principal Diagnosis: TIA (transient ischemic attack)  Discharge Diagnoses: Active Hospital Problems   Diagnosis Date Noted   TIA (transient ischemic attack) 12/20/2023    Priority: 1.   HTN (hypertension) 12/03/2013   COPD (chronic obstructive pulmonary disease) (HCC) 12/03/2013    Resolved Hospital Problems  No resolved problems to display.     Discharge Instructions     Ambulatory referral to Neurology   Complete by: As directed    Follow up with stroke clinic NP at [facility] in about 4-6 weeks. Thanks.   Diet - low sodium heart healthy   Complete by: As directed    Increase activity slowly   Complete by: As directed       Allergies as of 12/21/2023   No Known Allergies      Medication List     TAKE these medications    amLODipine  10 MG tablet Commonly known as: NORVASC  Take 10 mg by mouth daily.   aspirin  EC 81 MG tablet Take 1 tablet (81 mg total) by mouth daily.   atorvastatin  20 MG tablet Commonly known as: LIPITOR  Take 1 tablet (20 mg total) by mouth daily. Start taking on: December 22, 2023 What changed:  medication strength how much to take   carvedilol  12.5 MG tablet Commonly known as: COREG  Take 12.5 mg by mouth 2 (two) times daily.   clopidogrel  75 MG tablet Commonly known as: PLAVIX   TAKE 1 TABLET BY MOUTH ONCE DAILY WITH FOOD   losartan 100 MG tablet Commonly known as: COZAAR Take 100 mg by mouth daily.   nitroGLYCERIN  0.4 MG SL tablet Commonly known as: NITROSTAT  Place 1 tablet (0.4 mg total) under the tongue every 5 (five) minutes x 3 doses as needed for chest pain.        Follow-up Information     [facility]. Schedule an appointment as soon as possible for a visit in 1 month(s).   Specialty: Neurology Why: stroke clinic Contact information: [address] [place]   [phone]               No Known Allergies  Consultations: Neurology  Procedures:   Discharge Exam: BP 132/89 (BP Location: Right Arm)   Pulse 83   Temp 98.3 F (36.8 C) (Oral)   Resp 17   Ht 6' 1 (1.854 m)   Wt 66 kg   SpO2 99%   BMI 19.20 kg/m  Physical Exam Constitutional:      General: He is not in acute distress.    Appearance: Normal appearance.  HENT:     Head: Normocephalic and atraumatic.     Mouth/Throat:     Mouth: Mucous membranes are moist.   Eyes:     Extraocular Movements: Extraocular movements intact.    Cardiovascular:     Rate and Rhythm: Normal rate and regular rhythm.  Pulmonary:     Effort: Pulmonary effort is normal. No respiratory distress.     Breath sounds: Normal breath sounds. No wheezing.  Abdominal:     General: Bowel sounds are normal. There is no distension.     Palpations: Abdomen is soft.     Tenderness: There is no abdominal tenderness.   Musculoskeletal:        General: Normal range of motion.     Cervical back: Normal range of motion and neck supple.   Skin:    General: Skin is warm and dry.   Neurological:     General: No focal deficit present.     Mental Status: He is alert.   Psychiatric:        Mood and Affect: Mood normal.        Behavior: Behavior normal.      The results of significant diagnostics from this hospitalization (including imaging, microbiology, ancillary and laboratory) are listed below for reference.   Microbiology: No results found for this or any previous visit (from the past 240 hours).   Labs: BNP (last 3 results) No results for input(s): BNP in the last 8760 hours. Basic Metabolic Panel: Recent Labs  Lab 12/19/23 2058 12/20/23 0200 12/21/23 0606  NA 142  --  137  K 4.3  --  4.1  CL 107  --  106  CO2 25  --  24  GLUCOSE 100*  --  96  BUN 18  --  16  CREATININE 1.09 0.84 1.18  CALCIUM  9.5  --  8.7*   Liver Function Tests: Recent Labs  Lab 12/19/23 2058   AST 19  ALT 12  ALKPHOS 61  BILITOT 0.5  PROT 6.7  ALBUMIN 3.5   No results for input(s): LIPASE, AMYLASE in the last 168 hours. No results for input(s): AMMONIA in the last 168 hours. CBC: Recent Labs  Lab 12/19/23 2058 12/20/23 0200 12/21/23 0606  WBC 3.9* 4.2 4.5  HGB 11.7* 11.2* 11.6*  HCT 38.7* 36.5* 36.7*  MCV 92.1 91.7 89.7  PLT 248 215 213   Cardiac Enzymes: No results for input(s): CKTOTAL, CKMB, CKMBINDEX, TROPONINI in the last 168 hours. BNP: Invalid input(s): POCBNP CBG: No results for input(s): GLUCAP in the last 168 hours. D-Dimer No results for input(s): DDIMER in the last 72 hours. Hgb A1c Recent Labs    12/19/23 2058  HGBA1C 6.1*   Lipid Profile Recent Labs    12/20/23 0200  CHOL 122  HDL 54  LDLCALC 60  TRIG 42  CHOLHDL 2.3   Thyroid  function studies No results for input(s): TSH, T4TOTAL, T3FREE, THYROIDAB in the last 72 hours.  Invalid input(s): FREET3 Anemia work up No results for input(s): VITAMINB12, FOLATE, FERRITIN, TIBC, IRON, RETICCTPCT in the last 72 hours. Urinalysis    Component Value Date/Time   COLORURINE STRAW (A) 12/20/2023 0130   APPEARANCEUR CLEAR 12/20/2023 0130   LABSPEC 1.017 12/20/2023 0130   PHURINE 7.0 12/20/2023 0130   GLUCOSEU NEGATIVE 12/20/2023 0130   HGBUR NEGATIVE 12/20/2023 0130   BILIRUBINUR NEGATIVE 12/20/2023 0130   KETONESUR NEGATIVE 12/20/2023 0130   PROTEINUR NEGATIVE 12/20/2023 0130   UROBILINOGEN 1.0 02/05/2015 1730   NITRITE NEGATIVE 12/20/2023 0130   LEUKOCYTESUR NEGATIVE 12/20/2023 0130   Sepsis Labs Recent Labs  Lab 12/19/23 2058 12/20/23 0200 12/21/23 0606  WBC 3.9* 4.2 4.5   Microbiology No results found for this or any previous visit (from the past 240 hours).  Procedures/Studies: ECHOCARDIOGRAM COMPLETE Result Date: 12/21/2023    ECHOCARDIOGRAM REPORT   Patient Name:   Randy Walter Date of Exam: 12/21/2023 Medical Rec #:   [id number]      Height:       73.0 in Accession #:    [id number]     Weight:       145.5 lb Date of Birth:  June 05, 1946     BSA:          1.880 m Patient Age:    78 years       BP:           137/73 mmHg Patient Gender: M              HR:           73 bpm. Exam Location:  Inpatient Procedure: 2D Echo, Cardiac Doppler and Color Doppler (Both Spectral and Color            Flow Doppler were utilized during procedure). Indications:    TIA  History:        Patient has prior history of Echocardiogram examinations, most                 recent 02/07/2015.  Sonographer:    Eva Lash Referring Phys: [id number] SRISHTI L BHAGAT IMPRESSIONS  1. Left ventricular ejection fraction, by estimation, is 55 to 60%. The left ventricle has normal function. The left ventricle has no regional wall motion abnormalities. There is moderate left ventricular hypertrophy. Left ventricular diastolic parameters are consistent with Grade I diastolic dysfunction (impaired relaxation).  2. Right ventricular systolic function is normal. The right ventricular size is normal.  3. The mitral valve is normal in structure. Trivial mitral valve regurgitation. No evidence of mitral stenosis.  4. The aortic valve is tricuspid. There is moderate calcification of the aortic valve. Aortic valve regurgitation is not visualized. Aortic valve sclerosis is present, with no evidence of aortic valve stenosis. Aortic valve mean gradient measures 3.0 mmHg. Aortic valve Vmax measures 1.17 m/s.  5. The  inferior vena cava is normal in size with greater than 50% respiratory variability, suggesting right atrial pressure of 3 mmHg. Conclusion(s)/Recommendation(s): No intracardiac source of embolism detected on this transthoracic study. Consider a transesophageal echocardiogram to exclude cardiac source of embolism if clinically indicated. FINDINGS  Left Ventricle: Left ventricular ejection fraction, by estimation, is 55 to 60%. The left ventricle has normal function. The left  ventricle has no regional wall motion abnormalities. The left ventricular internal cavity size was normal in size. There is  moderate left ventricular hypertrophy. Left ventricular diastolic parameters are consistent with Grade I diastolic dysfunction (impaired relaxation). Right Ventricle: The right ventricular size is normal. No increase in right ventricular wall thickness. Right ventricular systolic function is normal. Left Atrium: Left atrial size was normal in size. Right Atrium: Right atrial size was normal in size. Pericardium: There is no evidence of pericardial effusion. Mitral Valve: The mitral valve is normal in structure. Trivial mitral valve regurgitation. No evidence of mitral valve stenosis. Tricuspid Valve: The tricuspid valve is normal in structure. Tricuspid valve regurgitation is not demonstrated. No evidence of tricuspid stenosis. Aortic Valve: The aortic valve is tricuspid. There is moderate calcification of the aortic valve. Aortic valve regurgitation is not visualized. Aortic valve sclerosis is present, with no evidence of aortic valve stenosis. Aortic valve mean gradient measures 3.0 mmHg. Aortic valve peak gradient measures 5.5 mmHg. Aortic valve area, by VTI measures 2.42 cm. Pulmonic Valve: The pulmonic valve was normal in structure. Pulmonic valve regurgitation is trivial. No evidence of pulmonic stenosis. Aorta: The aortic root is normal in size and structure. Venous: The inferior vena cava is normal in size with greater than 50% respiratory variability, suggesting right atrial pressure of 3 mmHg. IAS/Shunts: No atrial level shunt detected by color flow Doppler.  LEFT VENTRICLE PLAX 2D LVIDd:         3.60 cm      Diastology LVIDs:         2.60 cm      LV e' medial:    7.83 cm/s LV PW:         1.70 cm      LV E/e' medial:  8.9 LV IVS:        1.60 cm      LV e' lateral:   8.92 cm/s LVOT diam:     2.10 cm      LV E/e' lateral: 7.8 LV SV:         55 LV SV Index:   29 LVOT Area:     3.46 cm   LV Volumes (MOD) LV vol d, MOD A2C: 76.9 ml LV vol d, MOD A4C: 108.0 ml LV vol s, MOD A2C: 33.6 ml LV vol s, MOD A4C: 47.5 ml LV SV MOD A2C:     43.3 ml LV SV MOD A4C:     108.0 ml LV SV MOD BP:      51.7 ml RIGHT VENTRICLE TAPSE (M-mode): 1.9 cm LEFT ATRIUM             Index LA Vol (A2C):   30.8 ml 16.39 ml/m LA Vol (A4C):   28.3 ml 15.06 ml/m LA Biplane Vol: 29.8 ml 15.85 ml/m  AORTIC VALVE AV Area (Vmax):    2.50 cm AV Area (Vmean):   2.04 cm AV Area (VTI):     2.42 cm AV Vmax:           117.00 cm/s AV Vmean:  82.300 cm/s AV VTI:            0.226 m AV Peak Grad:      5.5 mmHg AV Mean Grad:      3.0 mmHg LVOT Vmax:         84.60 cm/s LVOT Vmean:        48.500 cm/s LVOT VTI:          0.158 m LVOT/AV VTI ratio: 0.70  AORTA Ao Asc diam: 3.70 cm MITRAL VALVE MV Area (PHT): 3.74 cm    SHUNTS MV Decel Time: 203 msec    Systemic VTI:  0.16 m MV E velocity: 69.70 cm/s  Systemic Diam: 2.10 cm MV A velocity: 85.40 cm/s MV E/A ratio:  0.82 Oneil Parchment MD Electronically signed by Oneil Parchment MD Signature Date/Time: 12/21/2023/11:26:37 AM    Final    CT ANGIO HEAD NECK W WO CM Result Date: 12/20/2023 EXAM: CT HEAD WITHOUT CTA HEAD AND NECK WITH AND WITHOUT 12/20/2023 05:15:46 AM TECHNIQUE: CTA of the head and neck was performed with and without the administration of intravenous contrast. Noncontrast CT of the head with reconstructed 2-D images are also provided for review. Multiplanar 2D and/or 3D reformatted images are provided for review. Automated exposure control, iterative reconstruction, and/or weight based adjustment of the mA/kV was utilized to reduce the radiation dose to as low as reasonably achievable. COMPARISON: CT angio head and neck 01/27/2019. MR head without and with contrast 12/19/2023. CLINICAL HISTORY: Stroke/TIA, determine embolic source. 75ml omni 350 IV. BIB EMS sudden right sided facial droop that last 10 minutes. Previous stroke with no deficits. Pt ambulatory with EMS; LKW 1840  lasted 10 minutes. FINDINGS: CT HEAD: BRAIN AND VENTRICLES: No acute intracranial hemorrhage. No mass effect or midline shift. No extra-axial fluid collection. Gray-white differentiation is maintained. No hydrocephalus. ORBITS: No acute abnormality. SINUSES: No acute abnormality. SOFT TISSUES AND SKULL: No acute abnormality. CTA NECK: AORTIC ARCH AND ARCH VESSELS: Atherosclerotic changes are present at the distal aortic arch and origin of the left subclavian artery without focal stenosis. The origins of the dominant vertebral artery and left common carotid artery are not imaged. CERVICAL CAROTID ARTERIES: Atherosclerotic changes are present at the right carotid bifurcation. A high-grade proximal stenosis is stable. A tandem 50% stenosis is stable. The more distal cervical right internal carotid artery is decreased in size compared to the left, stable. Atherosclerotic changes are present at the left carotid bifurcation. A 50% stenosis is present relative to the more distal vessel. CERVICAL VERTEBRAL ARTERIES: The left vertebral artery is the dominant vessel. It originates from the subclavian artery without focal stenosis. The right vertebral artery is occluded proximally and reconstituted at the v3 segment via muscular branches. VISUALIZED LUNGS AND MEDIASTINUM: Centrilobular emphysema changes are present. No nodule or mass lesion is present. No effusion or pneumothorax is present. SOFT TISSUES: No acute abnormality. BONES: Multilevel degenerative changes are again noted within the cervical spine. Dental caries are present within residual teeth. CTA HEAD: ANTERIOR CIRCULATION: Dense atherosclerotic calcifications are present within the cavernous internal carotid arteries bilaterally. 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 The ICA terminali are within normal limits bilaterally. The left a1 segment is dominant. The ACA and MCA branch vessels are normal bilaterally. No aneurysm is present. POSTERIOR  CIRCULATION: The right vertebral artery terminates at the PICA. Left PICA origin is visualized and normal. Irregularity is present in the distal basilar artery with a 50% stenosis. The superior cerebellar arteries are patent bilaterally. Two  posterior cerebral arteries originate from the basilar tip. A prominent right posterior communicating artery contributes. Moderate atherosclerosis, stable. OTHER: No dural venous sinus thrombosis on this non-dedicated study. IMPRESSION: 1. No acute intracranial hemorrhage or other acute abnormality. 2. Stable severe cavernous ICA stenoses bilaterally. 3. Stable high-grade proximal and tandem 50% stenosis at the right carotid bifurcation. 4. 50% stenosis at the left carotid bifurcation. 5. Irregularity in the distal basilar artery with a 50% stenosis. Electronically signed by: Lonni Necessary MD 12/20/2023 05:37 AM EDT RP Workstation: [id number]   MR Brain W and Wo Contrast Result Date: 12/20/2023 CLINICAL DATA:  Transient ischemic attack EXAM: MRI HEAD WITHOUT AND WITH CONTRAST TECHNIQUE: Multiplanar, multiecho pulse sequences of the brain and surrounding structures were obtained without and with intravenous contrast. CONTRAST:  8mL GADAVIST  GADOBUTROL  1 MMOL/ML IV SOLN COMPARISON:  01/27/2019 FINDINGS: Brain: No acute infarct, mass effect or extra-axial collection. No acute or chronic hemorrhage. Old right basal ganglia small vessel infarct. Normal CSF spaces. Old cerebellar small vessel infarcts. The midline structures are normal. There is no abnormal contrast enhancement. Vascular: Normal flow voids. Skull and upper cervical spine: Normal calvarium and skull base. Visualized upper cervical spine and soft tissues are normal. Sinuses/Orbits:Left maxillary sinus retention cyst. IMPRESSION: 1. No acute intracranial abnormality. 2. Old right basal ganglia and cerebellar small vessel infarcts. Electronically Signed   By: Franky Stanford M.D.   On: 12/20/2023 00:02     Time  coordinating discharge: Over 30 minutes    Alm Apo, MD  [facility] 12/21/2023, 12:46 PM

## 2023-12-21 NOTE — Plan of Care (Addendum)
 Pt was discharged before being seen today. Echo report pending. EEG report pending. P2Y12 = 193.   Ary Cummins, MD PhD Stroke Neurology 12/21/2023 11:29 AM

## 2023-12-22 NOTE — Procedures (Signed)
 Patient Name: Randy Walter  MRN: [id number]  Epilepsy Attending: Arlin MALVA Krebs  Referring Physician/Provider: Jerrie Lola CROME, MD  Date: 12/20/2023 Duration: 25.10 mins  Patient history: 78 y.o. 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 EEG to evaluate for seizure.  Level of alertness: Awake  AEDs during EEG study: None  Technical aspects: This EEG study was done with scalp electrodes positioned according to the 10-20 International system of electrode placement. Electrical activity was reviewed with band pass filter of 1-70Hz , sensitivity of 7 uV/mm, display speed of 71mm/sec with a 60Hz  notched filter applied as appropriate. EEG data were recorded continuously and digitally stored.  Video monitoring was available and reviewed as appropriate.  Description: The posterior dominant rhythm consists of 8-9 Hz activity of moderate voltage (25-35 uV) seen predominantly in posterior head regions, symmetric and reactive to eye opening and eye closing. Hyperventilation and photic stimulation were not performed.     IMPRESSION: This study is within normal limits. No seizures or epileptiform discharges were seen throughout the recording.  A normal interictal EEG does not exclude the diagnosis of epilepsy.   Ikaika Showers O Keisha Amer
